# Patient Record
Sex: Female | Born: 1990 | Race: White | Hispanic: No | Marital: Single | Smoking: Never smoker
Health system: Southern US, Community
[De-identification: ages and names within clinical notes are randomized; demographics above are authoritative.]

## PROBLEM LIST (undated history)

## (undated) ENCOUNTER — Inpatient Hospital Stay (HOSPITAL_COMMUNITY): Payer: Self-pay

## (undated) DIAGNOSIS — Z87898 Personal history of other specified conditions: Secondary | ICD-10-CM

## (undated) DIAGNOSIS — F329 Major depressive disorder, single episode, unspecified: Secondary | ICD-10-CM

## (undated) DIAGNOSIS — F419 Anxiety disorder, unspecified: Secondary | ICD-10-CM

## (undated) DIAGNOSIS — E669 Obesity, unspecified: Secondary | ICD-10-CM

## (undated) DIAGNOSIS — B001 Herpesviral vesicular dermatitis: Secondary | ICD-10-CM

## (undated) DIAGNOSIS — N200 Calculus of kidney: Secondary | ICD-10-CM

## (undated) DIAGNOSIS — E119 Type 2 diabetes mellitus without complications: Secondary | ICD-10-CM

## (undated) DIAGNOSIS — J309 Allergic rhinitis, unspecified: Secondary | ICD-10-CM

## (undated) DIAGNOSIS — E282 Polycystic ovarian syndrome: Secondary | ICD-10-CM

## (undated) DIAGNOSIS — F32A Depression, unspecified: Secondary | ICD-10-CM

## (undated) HISTORY — DX: Obesity, unspecified: E66.9

## (undated) HISTORY — DX: Polycystic ovarian syndrome: E28.2

## (undated) HISTORY — DX: Major depressive disorder, single episode, unspecified: F32.9

## (undated) HISTORY — DX: Herpesviral vesicular dermatitis: B00.1

## (undated) HISTORY — DX: Depression, unspecified: F32.A

## (undated) HISTORY — DX: Personal history of other specified conditions: Z87.898

## (undated) HISTORY — DX: Allergic rhinitis, unspecified: J30.9

## (undated) HISTORY — DX: Anxiety disorder, unspecified: F41.9

## (undated) HISTORY — DX: Calculus of kidney: N20.0

---
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
## 2009-02-11 ENCOUNTER — Emergency Department (HOSPITAL_COMMUNITY): Admission: EM | Admit: 2009-02-11 | Discharge: 2009-02-12 | Payer: Self-pay | Admitting: Emergency Medicine

## 2009-06-02 ENCOUNTER — Emergency Department (HOSPITAL_COMMUNITY): Admission: EM | Admit: 2009-06-02 | Discharge: 2009-06-02 | Payer: Self-pay | Admitting: Emergency Medicine

## 2009-06-03 ENCOUNTER — Emergency Department (HOSPITAL_COMMUNITY): Admission: EM | Admit: 2009-06-03 | Discharge: 2009-06-04 | Payer: Self-pay | Admitting: Emergency Medicine

## 2010-10-07 ENCOUNTER — Inpatient Hospital Stay (HOSPITAL_COMMUNITY)
Admission: AD | Admit: 2010-10-07 | Discharge: 2010-10-07 | Payer: Self-pay | Source: Home / Self Care | Admitting: Obstetrics and Gynecology

## 2010-12-07 ENCOUNTER — Inpatient Hospital Stay (HOSPITAL_COMMUNITY)
Admission: AD | Admit: 2010-12-07 | Discharge: 2010-12-08 | Disposition: A | Discharge status: Home / Self Care | Payer: 59 | Source: Ambulatory Visit | Attending: Obstetrics and Gynecology | Admitting: Obstetrics and Gynecology

## 2010-12-13 ENCOUNTER — Inpatient Hospital Stay (HOSPITAL_COMMUNITY)
Admission: RE | Admit: 2010-12-13 | Discharge: 2010-12-16 | DRG: 766 | Disposition: A | Discharge status: Home / Self Care | Payer: 59 | Source: Ambulatory Visit | Attending: Obstetrics and Gynecology | Admitting: Obstetrics and Gynecology

## 2010-12-13 DIAGNOSIS — O480 Post-term pregnancy: Principal | ICD-10-CM | POA: Diagnosis present

## 2010-12-13 LAB — RPR: RPR Ser Ql: NONREACTIVE

## 2010-12-13 LAB — CBC
HCT: 35.9 % — ABNORMAL LOW (ref 36.0–46.0)
Hemoglobin: 12 g/dL (ref 12.0–15.0)
MCH: 27.8 pg (ref 26.0–34.0)
MCHC: 33.4 g/dL (ref 30.0–36.0)
RBC: 4.31 MIL/uL (ref 3.87–5.11)

## 2010-12-14 LAB — CBC
HCT: 24.9 % — ABNORMAL LOW (ref 36.0–46.0)
Hemoglobin: 8.3 g/dL — ABNORMAL LOW (ref 12.0–15.0)
MCV: 84.1 fL (ref 78.0–100.0)
RDW: 13.8 % (ref 11.5–15.5)
WBC: 12.5 10*3/uL — ABNORMAL HIGH (ref 4.0–10.5)

## 2010-12-27 NOTE — Op Note (Signed)
  Morgan Ward, Morgan Ward                ACCOUNT NO.:  [id number]  MEDICAL RECORD NO.:  [id number]           PATIENT TYPE:  I  LOCATION:  9130                          FACILITY:  [facility]  PHYSICIAN:  Sharise Lippy L. Ivery Michalski, M.D.DATE OF BIRTH:  [birth year]  DATE OF PROCEDURE:  12/13/2010 DATE OF DISCHARGE:                              OPERATIVE REPORT   PREOPERATIVE DIAGNOSIS:  Intrauterine pregnancy at 40 plus weeks, failure to progress.  POSTOPERATIVE DIAGNOSIS:  Intrauterine pregnancy at 40 plus weeks, failure to progress.  PROCEDURE:  Primary low transverse cesarean section.  SURGEON:  Sihaam Chrobak L. Leahanna Buser, MD  ANESTHESIA:  Epidural.  FINDINGS:  Female infant in cephalic presentation, weighing 8 pounds 15 ounces, Apgars 8 at 1 minute, 9 at 5 minutes.  COMPLICATIONS:  None.  ESTIMATED BLOOD LOSS:  500 mL.  DRAINS:  Foley.  DESCRIPTION OF PROCEDURE:  The patient was taken to the operating room from Labor and Delivery.  She was then prepped and draped and her epidural was found to be adequate.  A low transverse incision was made and carried down to the fascia.  Fascia scored in the midline, extended laterally.  Rectus muscles were separated in the midline.  The peritoneum was entered bluntly.  The peritoneal incision was then stretched.  The bladder blade was inserted and the lower uterine segment was identified and the bladder flap was then created sharply and then digitally.  Bladder blade was then readjusted, a low transverse incision was made in the uterus, uterus was entered using a hemostat.  The baby was in cephalic presentation, was delivered easily with a vacuum extractor, appeared to be large for gestational age with a female infant, Apgars 8 at 1 minute and 9 at 5 minutes and weighed 8 pounds 15 ounces. The cord was clamped and cut, the baby was handed to the awaiting pediatrician.  The uterus was exteriorized.  It was cleared of all clots and debris.  The antibiotics and  Pitocin were given.  The uterine incision was closed in one layer using 0 Vicryl in a running locked stitch.  Uterus was returned to the abdomen.  Irrigation was performed. Hemostasis was excellent.  The peritoneum was closed using 0 Vicryl with running stitch.  The rectus muscles were reapproximated using the same 0 Vicryl.  The fascia was closed using 0 Vicryl starting each corner and meeting in the midline with a running stitch.  After irrigation of subcutaneous layer, the skin was closed with staples.  All sponge, lap, and instrument counts were correct x2.  The patient went to recovery room in stable condition.     Moises Terpstra L. Vincente Poli, M.D.     Florestine Avers  D:  12/13/2010  T:  12/14/2010  Job:  [id number]  Electronically Signed by Marcelle Overlie M.D. on 12/27/2010 07:56:05 AM

## 2011-01-16 LAB — URINALYSIS, ROUTINE W REFLEX MICROSCOPIC
Bilirubin Urine: NEGATIVE
Glucose, UA: NEGATIVE mg/dL
Hgb urine dipstick: NEGATIVE
Ketones, ur: NEGATIVE mg/dL
Nitrite: NEGATIVE
Protein, ur: NEGATIVE mg/dL
Specific Gravity, Urine: 1.025 (ref 1.005–1.030)
Urobilinogen, UA: 0.2 mg/dL (ref 0.0–1.0)
pH: 6.5 (ref 5.0–8.0)

## 2011-01-16 LAB — CBC
HCT: 32.1 % — ABNORMAL LOW (ref 36.0–46.0)
MCH: 31 pg (ref 26.0–34.0)
MCHC: 34.7 g/dL (ref 30.0–36.0)
MCV: 89.6 fL (ref 78.0–100.0)
Platelets: 226 10*3/uL (ref 150–400)
RDW: 12.8 % (ref 11.5–15.5)

## 2011-01-16 LAB — URINE MICROSCOPIC-ADD ON

## 2011-01-16 LAB — WET PREP, GENITAL
Trich, Wet Prep: NONE SEEN
Yeast Wet Prep HPF POC: NONE SEEN

## 2011-01-22 NOTE — Discharge Summary (Signed)
Morgan Ward, Morgan Ward                ACCOUNT NO.:  [id number]  MEDICAL RECORD NO.:  [id number]           PATIENT TYPE:  I  LOCATION:  9130                          [facility]ILITY:  [facility]  PHYSICIAN:  Dineen Kid. Rana Snare, M.D.    DATE OF BIRTH:  Jan 26, 1991  DATE OF ADMISSION:  12/13/2010 DATE OF DISCHARGE:  12/16/2010                              DISCHARGE SUMMARY   ADMITTING DIAGNOSES: 1. Intrauterine pregnancy at 40-1/7 weeks estimated gestational age. 2. Induction of labor secondary to postdates.  DISCHARGE DIAGNOSES: 1. Status post low-transverse cesarean section secondary to failure to     progress. 2. Viable female infant.  PROCEDURE:  Primary low-transverse cesarean section.  REASON FOR ADMISSION:  Please see written H and P.  HOSPITAL COURSE:  The patient is a 20 year old primigravida who was admitted to [facility] at 40-1/7 weeks estimated gestational age for postdate induction of labor.  On admission, vital signs were stable.  Fetal heart tones were reassuring.  Contractions were somewhat irregular.  Cervix was examined and found to be 4 cm dilated, 80% effaced, vertex at a 2 station.  Artificial rupture of membranes was performed, which revealed clear fluid.  The patient started on Pitocin and epidural was administered for her comfort.  After laboring all day without further change in cervix, decision was made to proceed with a primary low-transverse cesarean section.  The patient was then transferred to the operating room where epidural was dosed to an adequate surgical level.  A low-transverse incision was made with delivery of a viable female infant, weighing 8 pounds 15 ounces with Apgars of 8 at 1 minute and 9 at 5 minutes.  The patient tolerated the procedure well and was taken to the recovery room in stable condition. On postoperative day #1, the patient was without complaint.  Vital signs were stable.  She was afebrile.  Fundus firm and nontender.   Abdominal dressing had a small amount of drainage noted on the bandage.  Foley had been discontinued.  However, she had not voided at the time of rounding.  LABORATORY FINDINGS:  Hemoglobin 8.3 and platelet count of 207,000.  On postoperative day #2, the patient did complain of some pedal edema. Vital signs were stable.  Blood pressure was 118/64 to 125/68.  Abdomen soft.  Fundus firm and nontender.  Incision was clean, dry, and intact. She is voiding well, 2+ pedal edema was noted.  On postoperative day #3, the patient was without complaint.  Vital signs remained stable.  She was afebrile.  Fundus firm and nontender.  Incision was clean, dry and intact.  DISCHARGE INSTRUCTIONS:  Reviewed and the patient was later discharged home.  CONDITION ON DISCHARGE:  Stable.  DIET:  Regular as tolerated.  ACTIVITY:  No heavy lifting, no driving x2 weeks, and no vaginal entry.  FOLLOWUP:  The patient is to follow up in the office in 3-4 days for staple removal.  She is to call for temperature greater than 100 degrees, persistent nausea, vomiting, heavy vaginal bleeding and/or redness, or drainage from the incisional site.  DISCHARGE MEDICATIONS: 1. Tylox #30 one p.o. every  4-6 hours. 2. Motrin 600 mg every 6 hours. 3. Prenatal vitamins 1 p.o. daily. 4. Colace 1 p.o. daily p.r.n.     Morgan Ward, N.P.   ______________________________ Dineen Kid Rana Snare, M.D.    CC/MEDQ  D:  01/11/2011  T:  01/11/2011  Job:  [id number]  Electronically Signed by Morgan Ward N.P. on 01/12/2011 08:32:02 AM Electronically Signed by Candice Camp M.D. on 01/22/2011 07:26:05 AM

## 2011-02-11 LAB — URINALYSIS, ROUTINE W REFLEX MICROSCOPIC
Bilirubin Urine: NEGATIVE
Bilirubin Urine: NEGATIVE
Glucose, UA: NEGATIVE mg/dL
Glucose, UA: NEGATIVE mg/dL
Ketones, ur: >80 mg/dL — AB
Ketones, ur: >80 mg/dL — AB
Nitrite: NEGATIVE
Protein, ur: 30 mg/dL — AB
Protein, ur: NEGATIVE mg/dL
Urobilinogen, UA: 1 mg/dL (ref 0.0–1.0)
pH: 6 (ref 5.0–8.0)

## 2011-02-11 LAB — DIFFERENTIAL
Eosinophils Relative: 0 % (ref 0–5)
Lymphocytes Relative: 9 % — ABNORMAL LOW (ref 12–46)
Lymphs Abs: 1.1 10*3/uL (ref 0.7–4.0)
Monocytes Relative: 5 % (ref 3–12)
Neutrophils Relative %: 87 % — ABNORMAL HIGH (ref 43–77)

## 2011-02-11 LAB — URINE MICROSCOPIC-ADD ON

## 2011-02-11 LAB — CBC
HCT: 39.8 % (ref 36.0–46.0)
Platelets: 272 10*3/uL (ref 150–400)
RBC: 4.49 MIL/uL (ref 3.87–5.11)
WBC: 12.6 10*3/uL — ABNORMAL HIGH (ref 4.0–10.5)

## 2011-02-11 LAB — BASIC METABOLIC PANEL
CO2: 19 mEq/L (ref 19–32)
Chloride: 106 mEq/L (ref 96–112)
GFR calc Af Amer: >60 mL/min (ref >60)
Glucose, Bld: 107 mg/dL — ABNORMAL HIGH (ref 70–99)
Potassium: 3.9 mEq/L (ref 3.5–5.1)
Sodium: 135 mEq/L (ref 135–145)

## 2011-02-14 LAB — PREGNANCY, URINE: Preg Test, Ur: NEGATIVE

## 2011-02-14 LAB — URINALYSIS, ROUTINE W REFLEX MICROSCOPIC
Glucose, UA: NEGATIVE mg/dL
Specific Gravity, Urine: 1.023 (ref 1.005–1.030)
pH: 6.5 (ref 5.0–8.0)

## 2011-02-14 LAB — URINE MICROSCOPIC-ADD ON

## 2011-02-14 LAB — URINE CULTURE: Colony Count: 15000

## 2011-08-28 ENCOUNTER — Encounter: Payer: Self-pay | Admitting: Physician Assistant

## 2011-08-28 DIAGNOSIS — Z6841 Body Mass Index (BMI) 40.0 and over, adult: Secondary | ICD-10-CM | POA: Insufficient documentation

## 2011-08-28 DIAGNOSIS — J309 Allergic rhinitis, unspecified: Secondary | ICD-10-CM | POA: Insufficient documentation

## 2011-08-28 DIAGNOSIS — F418 Other specified anxiety disorders: Secondary | ICD-10-CM | POA: Insufficient documentation

## 2011-08-28 DIAGNOSIS — N200 Calculus of kidney: Secondary | ICD-10-CM | POA: Insufficient documentation

## 2011-08-28 DIAGNOSIS — E282 Polycystic ovarian syndrome: Secondary | ICD-10-CM | POA: Insufficient documentation

## 2011-08-28 DIAGNOSIS — B001 Herpesviral vesicular dermatitis: Secondary | ICD-10-CM | POA: Insufficient documentation

## 2012-04-08 ENCOUNTER — Ambulatory Visit (INDEPENDENT_AMBULATORY_CARE_PROVIDER_SITE_OTHER): Payer: 59 | Admitting: Physician Assistant

## 2012-04-08 VITALS — BP 129/68 | HR 76 | Temp 98.1°F | Resp 16 | Ht 60.75 in | Wt 241.0 lb

## 2012-04-08 DIAGNOSIS — F411 Generalized anxiety disorder: Secondary | ICD-10-CM

## 2012-04-08 DIAGNOSIS — F419 Anxiety disorder, unspecified: Secondary | ICD-10-CM

## 2012-04-08 DIAGNOSIS — J309 Allergic rhinitis, unspecified: Secondary | ICD-10-CM

## 2012-04-08 MED ORDER — FLUTICASONE PROPIONATE 50 MCG/ACT NA SUSP: 2.0000 | Freq: Every day | NASAL | Status: DC

## 2012-04-08 MED ORDER — ALPRAZOLAM 0.5 MG PO TABS: 0.5000 mg | ORAL_TABLET | Freq: Two times a day (BID) | ORAL | Status: AC | PRN

## 2012-04-08 NOTE — Progress Notes (Signed)
Patient ID: Morgan Ward MRN: [id number], DOB: 30-Apr-1991, 21 y.o. Date of Encounter: 04/08/2012, 6:34 PM  Primary Physician: No primary provider on file.  Chief Complaint: Anxiety  HPI: 21 y.o. year old female with history below presents with anxiety. Under increased amounts of stress at home. History of depression and anxiety through out her life. Has been off of her Celexa for about a month now. Anxiety began to worsen about 1-2 weeks ago due to her increased amount of stress at home. She is having a difficult time turning her mind off at night. Unable to sleep. She denies any depression. Wound prefer to stay off the Celexa now. Denies any depression. No suicidal or homicidal ideations. Good support system with family and friends.  She also mentions a flare up of her allergies and mild sinus pressure. Afebrile.   Past Medical History  Diagnosis Date  . Allergic rhinitis, cause unspecified   . PCOS (polycystic ovarian syndrome)   . Nephrolithiasis   . Depression   . Obesity   . History of angioedema   . Recurrent herpes labialis   . Anxiety   . Depression      Home Meds: Prior to Admission medications   Medication Sig Start Date End Date Taking? Authorizing Provider         citalopram (CELEXA) 20 MG tablet Take 20 mg by mouth daily.      Historical Provider, MD           Allergies:  Allergies  Allergen Reactions  . Sertraline Hcl (Sertraline Hcl) Diarrhea, Nausea Only and Other (See Comments)    headache  . Wellbutrin (Bupropion Hcl) Itching    History   Social History  . Marital Status: Single    Spouse Name: N/A    Number of Children: N/A  . Years of Education: N/A   Occupational History  . Not on file.   Social History Main Topics  . Smoking status: Never Smoker   . Smokeless tobacco: Not on file  . Alcohol Use: Not on file  . Drug Use: Not on file  . Sexually Active: Not on file   Other Topics Concern  . Not on file   Social History Narrative  .  No narrative on file     Review of Systems: Constitutional: negative for chills, fever, night sweats, or weight changes HEENT: negative for vision changes or hearing loss Cardiovascular: negative for chest pain or palpitations Respiratory: negative for hemoptysis, wheezing, shortness of breath, or cough Abdominal: negative for abdominal pain, nausea, or vomiting Dermatological: negative for rash Psychological: see above Neurologic: negative for headache, dizziness, or syncope   Physical Exam: Blood pressure 129/68, pulse 76, temperature 98.1 F (36.7 C), temperature source Oral, resp. rate 16, height 5' 0.75" (1.543 m), weight 241 lb (109.317 kg), last menstrual period 04/07/2012, SpO2 97.00%., Body mass index is 45.91 kg/(m^2). General: Well developed, well nourished, in no acute distress. Head: Normocephalic, atraumatic, eyes without discharge, sclera non-icteric, nares are without discharge. Post nasal drip.   Neck: Supple. No thyromegaly. Full ROM. No lymphadenopathy. Lungs: Clear bilaterally to auscultation without wheezes, rales, or rhonchi. Breathing is unlabored. Heart: RRR with S1 S2. No murmurs, rubs, or gallops appreciated. Msk:  Strength and tone normal for age. Extremities/Skin: Warm and dry. No clubbing or cyanosis. No edema. No rashes or suspicious lesions. Neuro: Alert and oriented X 3. Moves all extremities spontaneously. Gait is normal. CNII-XII grossly in tact. Psych:  Responds to questions appropriately with  a normal affect.      ASSESSMENT AND PLAN:  21 y.o. year old female with anxiety and allergic rhinits. 1. Anxiety -Trial of Xanax 0.5 mg #40 1 po bid prn no RF -Call with update 10-14 days, sooner if worse -RTC/ER precautions -Declines to restart Celexa at this time -Verbal safety contract   2. Allergic rhinitis -Flonase 2 sprays each nare daily #1 RF 6   Signed, Mieshia Pepitone, PA-C 04/08/2012 6:34 PM

## 2012-04-18 ENCOUNTER — Telehealth: Payer: Self-pay

## 2012-04-18 NOTE — Telephone Encounter (Signed)
PT WAS TO CALL BACK AND LET us KNOW HOW THE MEDICINE DID AND THE DOSAGE ALSO PLEASE CALL [phone]

## 2012-04-20 NOTE — Telephone Encounter (Signed)
PT STATES THAT SHE IS DOING WELL ON THE DOSE OF XANAX THAT YOU PRESCRIBE.  DO YOU WANT TO GIVE HER REFILLS?

## 2012-04-21 NOTE — Telephone Encounter (Signed)
Noted. Due for refill on 04/28/12.

## 2012-06-20 ENCOUNTER — Ambulatory Visit (INDEPENDENT_AMBULATORY_CARE_PROVIDER_SITE_OTHER): Payer: 59 | Admitting: Family Medicine

## 2012-06-20 VITALS — BP 132/80 | HR 101 | Temp 98.0°F | Resp 22 | Ht 60.0 in | Wt 227.2 lb

## 2012-06-20 DIAGNOSIS — R309 Painful micturition, unspecified: Secondary | ICD-10-CM

## 2012-06-20 DIAGNOSIS — N912 Amenorrhea, unspecified: Secondary | ICD-10-CM

## 2012-06-20 DIAGNOSIS — N23 Unspecified renal colic: Secondary | ICD-10-CM

## 2012-06-20 DIAGNOSIS — N898 Other specified noninflammatory disorders of vagina: Secondary | ICD-10-CM

## 2012-06-20 LAB — POCT URINALYSIS DIPSTICK
Bilirubin, UA: NEGATIVE
Blood, UA: NEGATIVE
Glucose, UA: NEGATIVE
Ketones, UA: NEGATIVE
Leukocytes, UA: NEGATIVE
Nitrite, UA: NEGATIVE
Protein, UA: NEGATIVE
Spec Grav, UA: >=1.03
Urobilinogen, UA: 0.2
pH, UA: 7

## 2012-06-20 LAB — POCT UA - MICROSCOPIC ONLY
Bacteria, U Microscopic: NEGATIVE
Casts, Ur, LPF, POC: NEGATIVE
Crystals, Ur, HPF, POC: NEGATIVE
Mucus, UA: NEGATIVE
RBC, urine, microscopic: NEGATIVE
WBC, Ur, HPF, POC: NEGATIVE
Yeast, UA: NEGATIVE

## 2012-06-20 LAB — POCT CBC
Hemoglobin: 13.1 g/dL (ref 12.2–16.2)
Lymph, poc: 2.3 (ref 0.6–3.4)
MCHC: 31.1 g/dL — AB (ref 31.8–35.4)
MID (cbc): 0.4 (ref 0–0.9)
MPV: 9.5 fL (ref 0–99.8)
POC Granulocyte: 3.9 (ref 2–6.9)
POC LYMPH PERCENT: 34.1 %L (ref 10–50)
POC MID %: 3.7 %M (ref 0–12)
Platelet Count, POC: 375 10*3/uL (ref 142–424)
RDW, POC: 14 %

## 2012-06-20 LAB — POCT WET PREP WITH KOH
KOH Prep POC: NEGATIVE
Trichomonas, UA: NEGATIVE
Yeast Wet Prep HPF POC: NEGATIVE

## 2012-06-20 MED ORDER — NITROFURANTOIN MONOHYD MACRO 100 MG PO CAPS: ORAL_CAPSULE | ORAL | Status: DC

## 2012-06-20 NOTE — Progress Notes (Signed)
[facility] [address], [place] [address] [phone]  Date:  06/20/2012   Name:  Morgan Ward   DOB:  1991/05/14   MRN:  [id number]  PCP:  No primary provider on file.    Chief Complaint: Urinary Tract Infection   History of Present Illness:  Morgan Ward is a 21 y.o. very pleasant female patient who presents with the following:  She is concerned about recurrent UTI.  She had a UTI in late June.  She then became pregnant and had an abortion on July 15th.  On her follow- up exam she had an ultrasound and HCG- she was no longer pregnant at that time.  However, she has had 2 UTIs since her procedure.  She became pregnant when she cheated on her finance- she is pretty upset about the whole situation.   She was started on Septra for another UTI 2 days ago- she was seen at [facility].  She is not sure if they took a culture.  She has one more day of medication to go. Continues to note pain "across my lower belly" both with urination and without.    She has had these 3 UTIs in the past year or so.  She is now on OCP- no menses yet since her procedure.  She is on the first week of her 2nd pack of pills.  No nausea or vomiting, no fever.  Eating normally.  She was on some weight loss medication per her OBG- wants to know if I can refill this.   Patient Active Problem List  Diagnosis  . Allergic rhinitis, cause unspecified  . PCOS (polycystic ovarian syndrome)  . Nephrolithiasis  . Depression  . Obesity  . Recurrent herpes labialis    Past Medical History  Diagnosis Date  . Allergic rhinitis, cause unspecified   . PCOS (polycystic ovarian syndrome)   . Nephrolithiasis   . Depression   . Obesity   . History of angioedema   . Recurrent herpes labialis   . Anxiety   . Depression     No past surgical history on file.  History  Substance Use Topics  . Smoking status: Never Smoker   . Smokeless tobacco: Not on file  . Alcohol Use: Not on file     No family history on file.  Allergies  Allergen Reactions  . Sertraline Hcl (Sertraline Hcl) Diarrhea, Nausea Only and Other (See Comments)    headache  . Wellbutrin (Bupropion Hcl) Itching    Medication list has been reviewed and updated.  Current Outpatient Prescriptions on File Prior to Visit  Medication Sig Dispense Refill  . norethindrone-ethinyl estradiol (JUNEL FE,GILDESS FE,LOESTRIN FE) 1-20 MG-MCG tablet Take 1 tablet by mouth daily.      . citalopram (CELEXA) 20 MG tablet Take 20 mg by mouth daily.        . fluticasone (FLONASE) 50 MCG/ACT nasal spray Place 2 sprays into the nose daily.  16 g  6    Review of Systems:  As per HPI- otherwise negative.   Physical Examination: Filed Vitals:   06/20/12 1629  BP: 132/80  Pulse: 101  Temp: 98 F (36.7 C)  Resp: 22   Filed Vitals:   06/20/12 1629  Height: 5' (1.524 m)  Weight: 227 lb 3.2 oz (103.057 kg)   Body mass index is 44.37 kg/(m^2). Ideal Body Weight: Weight in (lb) to have BMI = 25: 127.7   GEN: WDWN, NAD,  Non-toxic, A & O x 3, obese HEENT: Atraumatic, Normocephalic. Neck supple. No masses, No LAD.  Oropharynx wnl, PEERL Ears and Nose: No external deformity. CV: RRR, No M/G/R. No JVD. No thrill. No extra heart sounds. PULM: CTA B, no wheezes, crackles, rhonchi. No retractions. No resp. distress. No accessory muscle use. ABD: S, ND, +BS. No rebound. No HSM.  No CVA tenderness.  She has minimal suprapubic/ RLQ/ RUQ tenderness not consistent with appendicitis.  Negative murphy's sign EXTR: No c/c/e NEURO Normal gait.  PSYCH: Normally interactive. Conversant. Not depressed or anxious appearing.  Calm demeanor.  GU: normal exam, no CMT or adnexal tenderness.  No abnormal discharge, no external lesions.     Results for orders placed in visit on 06/20/12  POCT UA - MICROSCOPIC ONLY      Component Value Range   WBC, Ur, HPF, POC neg     RBC, urine, microscopic neg     Bacteria, U Microscopic neg      Mucus, UA neg     Epithelial cells, urine per micros 0-4     Crystals, Ur, HPF, POC neg     Casts, Ur, LPF, POC neg     Yeast, UA neg    POCT URINALYSIS DIPSTICK      Component Value Range   Color, UA yellow     Clarity, UA clear     Glucose, UA neg     Bilirubin, UA neg     Ketones, UA neg     Spec Grav, UA >=1.030     Blood, UA neg     pH, UA 7.0     Protein, UA neg     Urobilinogen, UA 0.2     Nitrite, UA neg     Leukocytes, UA Negative    POCT URINE PREGNANCY      Component Value Range   Preg Test, Ur Negative    POCT WET PREP WITH KOH      Component Value Range   Trichomonas, UA Negative     Clue Cells Wet Prep HPF POC 0-1     Epithelial Wet Prep HPF POC 0-5     Yeast Wet Prep HPF POC neg     Bacteria Wet Prep HPF POC 2+     RBC Wet Prep HPF POC neg     WBC Wet Prep HPF POC 0-5     KOH Prep POC Negative    POCT CBC      Component Value Range   WBC 6.6  4.6 - 10.2 K/uL   Lymph, poc 2.3  0.6 - 3.4   POC LYMPH PERCENT 34.1  10 - 50 %L   MID (cbc) 0.4  0 - 0.9   POC MID % 3.7  0 - 12 %M   POC Granulocyte 3.9  2 - 6.9   Granulocyte percent 59.2  37 - 80 %G   RBC 4.75  4.04 - 5.48 M/uL   Hemoglobin 13.1  12.2 - 16.2 g/dL   HCT, POC 16.1  09.6 - 47.9 %   MCV 88.6  80 - 97 fL   MCH, POC 27.6  27 - 31.2 pg   MCHC 31.1 (*) 31.8 - 35.4 g/dL   RDW, POC 04.5     Platelet Count, POC 375  142 - 424 K/uL   MPV 9.5  0 - 99.8 fL    Assessment and Plan: 1. Urinary pain  POCT UA - Microscopic Only, POCT urinalysis dipstick, POCT CBC,  nitrofurantoin, macrocrystal-monohydrate, (MACROBID) 100 MG capsule  2. Vaginal Discharge  POCT Wet Prep with KOH, GC/chlamydia probe amp, genital  3. Amenorrhea  POCT urine pregnancy   Recurrent ?UTI.  It seems as through her symptoms are more pain, less classic UTI symptoms.  Await genprobe as infection is a possibility.  Gave supply of macrobid to use after intercourse as needed, as she has noted possible recurrent UTI after intercourse.   Also consider ovarian cyst.  If her labs are negative and her symptoms persist consider pelvic ultrasound.  It is also possible that emotional upset is contributing to her pain.  If she gets worse or has any other symptoms please let me know right away.    Abbe Amsterdam, MD

## 2013-01-23 ENCOUNTER — Other Ambulatory Visit: Payer: Self-pay | Admitting: Family Medicine

## 2015-06-11 ENCOUNTER — Ambulatory Visit (INDEPENDENT_AMBULATORY_CARE_PROVIDER_SITE_OTHER): Payer: 59 | Admitting: Family Medicine

## 2015-06-11 ENCOUNTER — Ambulatory Visit (INDEPENDENT_AMBULATORY_CARE_PROVIDER_SITE_OTHER): Payer: 59

## 2015-06-11 VITALS — BP 118/76 | HR 81 | Temp 98.2°F | Resp 18 | Ht 60.0 in | Wt 228.0 lb

## 2015-06-11 DIAGNOSIS — R1030 Lower abdominal pain, unspecified: Secondary | ICD-10-CM | POA: Diagnosis not present

## 2015-06-11 DIAGNOSIS — K5900 Constipation, unspecified: Secondary | ICD-10-CM | POA: Diagnosis not present

## 2015-06-11 DIAGNOSIS — K625 Hemorrhage of anus and rectum: Secondary | ICD-10-CM

## 2015-06-11 DIAGNOSIS — R110 Nausea: Secondary | ICD-10-CM

## 2015-06-11 LAB — POCT URINALYSIS DIPSTICK
Bilirubin, UA: NEGATIVE
Blood, UA: NEGATIVE
Glucose, UA: NEGATIVE
Ketones, UA: NEGATIVE
Leukocytes, UA: NEGATIVE
Nitrite, UA: NEGATIVE
Protein, UA: NEGATIVE
Spec Grav, UA: 1.025
Urobilinogen, UA: 0.2
pH, UA: 7

## 2015-06-11 LAB — POCT CBC
Granulocyte percent: 77.2 %G (ref 37–80)
HCT, POC: 39.5 % (ref 37.7–47.9)
Hemoglobin: 12.7 g/dL (ref 12.2–16.2)
Lymph, poc: 2.3 (ref 0.6–3.4)
MCH, POC: 27.8 pg (ref 27–31.2)
MCHC: 32.3 g/dL (ref 31.8–35.4)
MCV: 86.1 fL (ref 80–97)
MID (cbc): 0.2 (ref 0–0.9)
MPV: 7.6 fL (ref 0–99.8)
POC Granulocyte: 8.6 — AB (ref 2–6.9)
POC LYMPH PERCENT: 20.7 %L (ref 10–50)
POC MID %: 2.1 %M (ref 0–12)
Platelet Count, POC: 341 10*3/uL (ref 142–424)
RBC: 4.58 M/uL (ref 4.04–5.48)
RDW, POC: 12.9 %
WBC: 11.2 10*3/uL — AB (ref 4.6–10.2)

## 2015-06-11 LAB — POCT UA - MICROSCOPIC ONLY
Casts, Ur, LPF, POC: NEGATIVE
Crystals, Ur, HPF, POC: NEGATIVE
Yeast, UA: NEGATIVE

## 2015-06-11 LAB — IFOBT (OCCULT BLOOD): IFOBT: POSITIVE

## 2015-06-11 LAB — POCT URINE PREGNANCY: Preg Test, Ur: NEGATIVE

## 2015-06-11 MED ORDER — AZITHROMYCIN 250 MG PO TABS: ORAL_TABLET | ORAL | Status: DC

## 2015-06-11 NOTE — Patient Instructions (Signed)
Abdominal Pain, Women °Abdominal (stomach, pelvic, or belly) pain can be caused by many things. It is important to tell your doctor: °· The location of the pain. °· Does it come and go or is it present all the time? °· Are there things that start the pain (eating certain foods, exercise)? °· Are there other symptoms associated with the pain (fever, nausea, vomiting, diarrhea)? °All of this is helpful to know when trying to find the cause of the pain. °CAUSES  °· Stomach: virus or bacteria infection, or ulcer. °· Intestine: appendicitis (inflamed appendix), regional ileitis (Crohn's disease), ulcerative colitis (inflamed colon), irritable bowel syndrome, diverticulitis (inflamed diverticulum of the colon), or cancer of the stomach or intestine. °· Gallbladder disease or stones in the gallbladder. °· Kidney disease, kidney stones, or infection. °· Pancreas infection or cancer. °· Fibromyalgia (pain disorder). °· Diseases of the female organs: °¨ Uterus: fibroid (non-cancerous) tumors or infection. °¨ Fallopian tubes: infection or tubal pregnancy. °¨ Ovary: cysts or tumors. °¨ Pelvic adhesions (scar tissue). °¨ Endometriosis (uterus lining tissue growing in the pelvis and on the pelvic organs). °¨ Pelvic congestion syndrome (female organs filling up with blood just before the menstrual period). °¨ Pain with the menstrual period. °¨ Pain with ovulation (producing an egg). °¨ Pain with an IUD (intrauterine device, birth control) in the uterus. °¨ Cancer of the female organs. °· Functional pain (pain not caused by a disease, may improve without treatment). °· Psychological pain. °· Depression. °DIAGNOSIS  °Your doctor will decide the seriousness of your pain by doing an examination. °· Blood tests. °· X-rays. °· Ultrasound. °· CT scan (computed tomography, special type of X-ray). °· MRI (magnetic resonance imaging). °· Cultures, for infection. °· Barium enema (dye inserted in the large intestine, to better view it with  X-rays). °· Colonoscopy (looking in intestine with a lighted tube). °· Laparoscopy (minor surgery, looking in abdomen with a lighted tube). °· Major abdominal exploratory surgery (looking in abdomen with a large incision). °TREATMENT  °The treatment will depend on the cause of the pain.  °· Many cases can be observed and treated at home. °· Over-the-counter medicines recommended by your caregiver. °· Prescription medicine. °· Antibiotics, for infection. °· Birth control pills, for painful periods or for ovulation pain. °· Hormone treatment, for endometriosis. °· Nerve blocking injections. °· Physical therapy. °· Antidepressants. °· Counseling with a psychologist or psychiatrist. °· Minor or major surgery. °HOME CARE INSTRUCTIONS  °· Do not take laxatives, unless directed by your caregiver. °· Take over-the-counter pain medicine only if ordered by your caregiver. Do not take aspirin because it can cause an upset stomach or bleeding. °· Try a clear liquid diet (broth or water) as ordered by your caregiver. Slowly move to a bland diet, as tolerated, if the pain is related to the stomach or intestine. °· Have a thermometer and take your temperature several times a day, and record it. °· Bed rest and sleep, if it helps the pain. °· Avoid sexual intercourse, if it causes pain. °· Avoid stressful situations. °· Keep your follow-up appointments and tests, as your caregiver orders. °· If the pain does not go away with medicine or surgery, you may try: °¨ Acupuncture. °¨ Relaxation exercises (yoga, meditation). °¨ Group therapy. °¨ Counseling. °SEEK MEDICAL CARE IF:  °· You notice certain foods cause stomach pain. °· Your home care treatment is not helping your pain. °· You need stronger pain medicine. °· You want your IUD removed. °· You feel faint or   lightheaded. °· You develop nausea and vomiting. °· You develop a rash. °· You are having side effects or an allergy to your medicine. °SEEK IMMEDIATE MEDICAL CARE IF:  °· Your  pain does not go away or gets worse. °· You have a fever. °· Your pain is felt only in portions of the abdomen. The right side could possibly be appendicitis. The left lower portion of the abdomen could be colitis or diverticulitis. °· You are passing blood in your stools (bright red or black tarry stools, with or without vomiting). °· You have blood in your urine. °· You develop chills, with or without a fever. °· You pass out. °MAKE SURE YOU:  °· Understand these instructions. °· Will watch your condition. °· Will get help right away if you are not doing well or get worse. °Document Released: 08/19/2007 Document Revised: 03/08/2014 Document Reviewed: 09/08/2009 °[employer]® Patient Information ©2015 [employer]. This information is not intended to replace advice given to you by your health care provider. Make sure you discuss any questions you have with your health care provider. ° °

## 2015-06-11 NOTE — Progress Notes (Signed)
Chief Complaint:  Chief Complaint  Patient presents with  . Abdominal Pain    Started this morning, feels bloated. Lower abdominal pain.   . Blood In Stools    Noticed a small amount this morning after a BM.   Marland Kitchen Chills    HPI: Morgan Ward is a 24 y.o. female who reports to [facility] today complaining of stomach pain and had BM, was not diarrhea that looked not normal this AM and then later she tried to go have another BM , she had clots instead of stool.  She took over the counter stomach meds ie pepto , She came home and laid around. She had blood coming out with BM not.  No hemorrhoids.  Mom had diverticulitis and had intestine removed but no crohns UC  IBD or colon cancer LMP July 17, she is sexually active but not for a while + PCOS and had no ovarian cyst several months ago.  Last time she ahd sex was ? 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  She had a pap in 2016, [place] [facility] and was normal Not on birth control, no fevers or chills. Not associated with food, she is eating and drinking normally.    Past Medical History  Diagnosis Date  . Allergic rhinitis, cause unspecified   . PCOS (polycystic ovarian syndrome)   . Nephrolithiasis   . Depression   . Obesity   . History of angioedema   . Recurrent herpes labialis   . Anxiety   . Depression    Past Surgical History  Procedure Laterality Date  . Cesarean section     History   Social History  . Marital Status: Single    Spouse Name: N/A  . Number of Children: N/A  . Years of Education: N/A   Social History Main Topics  . Smoking status: Never Smoker   . Smokeless tobacco: Not on file  . Alcohol Use: Not on file  . Drug Use: Not on file  . Sexual Activity: Not on file   Other Topics Concern  . None   Social History Narrative   Family History  Problem Relation Age of Onset  . Diverticulitis Mother   .  Diabetes Mother   . Hypertension Mother    Allergies  Allergen Reactions  . Sertraline Hcl [Sertraline Hcl] Diarrhea, Nausea Only and Other (See Comments)    headache  . Wellbutrin [Bupropion Hcl] Itching   Prior to Admission medications   Medication Sig Start Date End Date Taking? Authorizing Provider  citalopram (CELEXA) 20 MG tablet Take 20 mg by mouth daily.      Historical Provider, MD  fluticasone (FLONASE) 50 MCG/ACT nasal spray Place 2 sprays into the nose daily. 04/08/12 04/08/13  Lagace M Dunn, PA-C  nitrofurantoin, macrocrystal-monohydrate, (MACROBID) 100 MG capsule TAKE ONE CAPSULE BY MOUTH AS NEEDED AFTER  INTERCOURSE Patient not taking: Reported on 06/11/2015 01/23/13   Areta Haber Dunn, PA-C  norethindrone-ethinyl estradiol (JUNEL FE,GILDESS FE,LOESTRIN FE) 1-20 MG-MCG tablet Take 1 tablet by mouth daily.    Historical Provider, MD     ROS: The patient denies fevers, chills, night sweats, unintentional weight loss, chest pain, palpitations, wheezing, dyspnea on exertion, , vomiting, dysuria, hematuria, melena, numbness, weakness, or tingling.   All other systems have been reviewed and were otherwise negative with the exception of those mentioned  in the HPI and as above.    PHYSICAL EXAM: Filed Vitals:   06/11/15 1542  BP: 118/76  Pulse: 81  Temp: 98.2 F (36.8 C)  Resp: 18   Body mass index is 44.53 kg/(m^2).   General: Alert, no acute distress, obese white female HEENT:  Normocephalic, atraumatic, oropharynx patent. EOMI, PERRLA Cardiovascular:  Regular rate and rhythm, no rubs murmurs or gallops.  No Carotid bruits, radial pulse intact. No pedal edema.  Respiratory: Clear to auscultation bilaterally.  No wheezes, rales, or rhonchi.  No cyanosis, no use of accessory musculature Abdominal: No organomegaly, abdomen is soft and bilateral pelvic area minimally -tender, positive bowel sounds. No masses. No guarding or rebound Skin: No rashes. Neurologic: Facial musculature  symmetric. Psychiatric: Patient acts appropriately throughout our interaction. Lymphatic: No cervical or submandibular lymphadenopathy Musculoskeletal: Gait intact. No edema, tenderness No CMT, vaginal vault normal. No discahrge No hemorrhoids, no appreciable fissures but not absolutely sure since difficut to fully access base don large body habitus No masses in rectal vault or hemorrhoids   LABS: Results for orders placed or performed in visit on 06/11/15  POCT CBC  Result Value Ref Range   WBC 11.2 (A) 4.6 - 10.2 K/uL   Lymph, poc 2.3 0.6 - 3.4   POC LYMPH PERCENT 20.7 10 - 50 %L   MID (cbc) 0.2 0 - 0.9   POC MID % 2.1 0 - 12 %M   POC Granulocyte 8.6 (A) 2 - 6.9   Granulocyte percent 77.2 37 - 80 %G   RBC 4.58 4.04 - 5.48 M/uL   Hemoglobin 12.7 12.2 - 16.2 g/dL   HCT, POC 39.5 37.7 - 47.9 %   MCV 86.1 80 - 97 fL   MCH, POC 27.8 27 - 31.2 pg   MCHC 32.3 31.8 - 35.4 g/dL   RDW, POC 12.9 %   Platelet Count, POC 341 142 - 424 K/uL   MPV 7.6 0 - 99.8 fL  POCT urine pregnancy  Result Value Ref Range   Preg Test, Ur Negative Negative  POCT urinalysis dipstick  Result Value Ref Range   Color, UA yellow    Clarity, UA clear    Glucose, UA negative    Bilirubin, UA negative    Ketones, UA negative    Spec Grav, UA 1.025    Blood, UA negative    pH, UA 7.0    Protein, UA negative    Urobilinogen, UA 0.2    Nitrite, UA negative    Leukocytes, UA Negative Negative  POCT UA - Microscopic Only  Result Value Ref Range   WBC, Ur, HPF, POC 0-1    RBC, urine, microscopic 0-1    Bacteria, U Microscopic trace    Mucus, UA small    Epithelial cells, urine per micros 0-3    Crystals, Ur, HPF, POC negative    Casts, Ur, LPF, POC negative    Yeast, UA negative   IFOBT POC (occult bld, rslt in office)  Result Value Ref Range   IFOBT Positive      EKG/XRAY:   Primary read interpreted by Dr. Marin Comment at [facility]. Please comment if there is obstruction + taken peptobismol recently.  No  free air   ASSESSMENT/PLAN: Encounter Diagnoses  Name Primary?  . Nausea without vomiting Yes  . Lower abdominal pain   . Rectal bleeding   . Constipation, unspecified constipation type    Multifactorial etiology I think this maybe related to constipation, gas  with  some straining resulting in having "small clots" with straining while on toilet for BM.  She has a hx of ovarian cysts as well which could be another possibility vs appendicitis, which I think less likely since she has bilateral pelvic pain.  Will treat for PID with azithromycin, encourage laxatives PRecautiosn given for worsening sxs Note given Labs pending: CMP, lipase, ESR If no improvement then consider CT or [place] abd and pelvis Needs to recheck hemosure at some point  Gross sideeffects, risk and benefits, and alternatives of medications d/w patient. Patient is aware that all medications have potential sideeffects and we are unable to predict every sideeffect or drug-drug interaction that may occur.  Nikiyah Fackler DO  06/12/2015 10:16 AM

## 2015-06-12 LAB — COMPREHENSIVE METABOLIC PANEL
ALT: 12 U/L (ref 6–29)
AST: 13 U/L (ref 10–30)
Albumin: 4.5 g/dL (ref 3.6–5.1)
Alkaline Phosphatase: 50 U/L (ref 33–115)
BUN: 9 mg/dL (ref 7–25)
CO2: 27 mmol/L (ref 20–31)
Calcium: 9.4 mg/dL (ref 8.6–10.2)
Chloride: 104 mmol/L (ref 98–110)
Creat: 0.63 mg/dL (ref 0.50–1.10)
Glucose, Bld: 103 mg/dL — ABNORMAL HIGH (ref 65–99)
Sodium: 139 mmol/L (ref 135–146)
Total Bilirubin: 0.3 mg/dL (ref 0.2–1.2)

## 2015-06-12 LAB — SEDIMENTATION RATE: Sed Rate: 1 mm/hr (ref 0–20)

## 2015-06-12 LAB — LIPASE: Lipase: 21 U/L (ref 7–60)

## 2015-06-12 LAB — COMPREHENSIVE METABOLIC PANEL WITH GFR
Potassium: 4.2 mmol/L (ref 3.5–5.3)
Total Protein: 6.9 g/dL (ref 6.1–8.1)

## 2015-06-27 ENCOUNTER — Ambulatory Visit (INDEPENDENT_AMBULATORY_CARE_PROVIDER_SITE_OTHER): Payer: 59 | Admitting: Family Medicine

## 2015-06-27 VITALS — BP 112/72 | HR 83 | Temp 98.5°F | Resp 14 | Ht 59.0 in | Wt 229.0 lb

## 2015-06-27 DIAGNOSIS — R300 Dysuria: Secondary | ICD-10-CM

## 2015-06-27 DIAGNOSIS — N3090 Cystitis, unspecified without hematuria: Secondary | ICD-10-CM | POA: Diagnosis not present

## 2015-06-27 LAB — POCT URINALYSIS DIPSTICK
Glucose, UA: NEGATIVE
NITRITE UA: POSITIVE
PROTEIN UA: 100
RBC UA: NEGATIVE
Spec Grav, UA: 1.02
Urobilinogen, UA: 4
pH, UA: 5

## 2015-06-27 LAB — POCT UA - MICROSCOPIC ONLY
CASTS, UR, LPF, POC: NEGATIVE
Crystals, Ur, HPF, POC: NEGATIVE
Yeast, UA: NEGATIVE

## 2015-06-27 MED ORDER — SULFAMETHOXAZOLE-TRIMETHOPRIM 800-160 MG PO TABS: 1.0000 | ORAL_TABLET | Freq: Two times a day (BID) | ORAL | Status: DC

## 2015-06-27 NOTE — Patient Instructions (Signed)
Continue using the a zone if needed  Drink plenty of fluids  Take the sulfamethoxazole one pill twice daily with some food at breakfast and supper  Return if further symptoms, especially such as pain or fever or nausea or vomiting or urinating a lot of blood  Urinary Tract Infection Urinary tract infections (UTIs) can develop anywhere along your urinary tract. Your urinary tract is your body's drainage system for removing wastes and extra water. Your urinary tract includes two kidneys, two ureters, a bladder, and a urethra. Your kidneys are a pair of bean-shaped organs. Each kidney is about the size of your fist. They are located below your ribs, one on each side of your spine. CAUSES Infections are caused by microbes, which are microscopic organisms, including fungi, viruses, and bacteria. These organisms are so small that they can only be seen through a microscope. Bacteria are the microbes that most commonly cause UTIs. SYMPTOMS  Symptoms of UTIs may vary by age and gender of the patient and by the location of the infection. Symptoms in young women typically include a frequent and intense urge to urinate and a painful, burning feeling in the bladder or urethra during urination. Older women and men are more likely to be tired, shaky, and weak and have muscle aches and abdominal pain. A fever may mean the infection is in your kidneys. Other symptoms of a kidney infection include pain in your back or sides below the ribs, nausea, and vomiting. DIAGNOSIS To diagnose a UTI, your caregiver will ask you about your symptoms. Your caregiver also will ask to provide a urine sample. The urine sample will be tested for bacteria and white blood cells. White blood cells are made by your body to help fight infection. TREATMENT  Typically, UTIs can be treated with medication. Because most UTIs are caused by a bacterial infection, they usually can be treated with the use of antibiotics. The choice of antibiotic  and length of treatment depend on your symptoms and the type of bacteria causing your infection. HOME CARE INSTRUCTIONS  If you were prescribed antibiotics, take them exactly as your caregiver instructs you. Finish the medication even if you feel better after you have only taken some of the medication.  Drink enough water and fluids to keep your urine clear or pale yellow.  Avoid caffeine, tea, and carbonated beverages. They tend to irritate your bladder.  Empty your bladder often. Avoid holding urine for long periods of time.  Empty your bladder before and after sexual intercourse.  After a bowel movement, women should cleanse from front to back. Use each tissue only once. SEEK MEDICAL CARE IF:   You have back pain.  You develop a fever.  Your symptoms do not begin to resolve within 3 days. SEEK IMMEDIATE MEDICAL CARE IF:   You have severe back pain or lower abdominal pain.  You develop chills.  You have nausea or vomiting.  You have continued burning or discomfort with urination. MAKE SURE YOU:   Understand these instructions.  Will watch your condition.  Will get help right away if you are not doing well or get worse. Document Released: 08/01/2005 Document Revised: 04/22/2012 Document Reviewed: 11/30/2011 [facility] Patient Information 2015 [place], [place]. This information is not intended to replace advice given to you by your health care provider. Make sure you discuss any questions you have with your health care provider.

## 2015-06-27 NOTE — Progress Notes (Signed)
Subjective:  Patient ID: Morgan Ward, female    DOB: May 04, 1991  Age: 24 y.o. MRN: [id number]  24 year old lady who has been having dysuria for couple of days. She has a history of a lot of urinary tract infections. She took some Azo and caused her nausea because she took it on a piece stomach. She has had a little back pain     Objective:   No major distress. No CVA tenderness. Is a little tender right on the paraspinous muscle on the left. Her abdomen is soft without mass or tenderness.  Assessment & Plan:   Assessment:  Cystitis Dysuria Mild back pain  Plan:  Treat routinely. Instructed her to return if worse  Patient Instructions  Continue using the a zone if needed  Drink plenty of fluids  Take the sulfamethoxazole one pill twice daily with some food at breakfast and supper  Return if further symptoms, especially such as pain or fever or nausea or vomiting or urinating a lot of blood  Urinary Tract Infection Urinary tract infections (UTIs) can develop anywhere along your urinary tract. Your urinary tract is your body's drainage system for removing wastes and extra water. Your urinary tract includes two kidneys, two ureters, a bladder, and a urethra. Your kidneys are a pair of bean-shaped organs. Each kidney is about the size of your fist. They are located below your ribs, one on each side of your spine. CAUSES Infections are caused by microbes, which are microscopic organisms, including fungi, viruses, and bacteria. These organisms are so small that they can only be seen through a microscope. Bacteria are the microbes that most commonly cause UTIs. SYMPTOMS  Symptoms of UTIs may vary by age and gender of the patient and by the location of the infection. Symptoms in young women typically include a frequent and intense urge to urinate and a painful, burning feeling in the bladder or urethra during urination. Older women and men are more likely to be tired, shaky, and weak  and have muscle aches and abdominal pain. A fever may mean the infection is in your kidneys. Other symptoms of a kidney infection include pain in your back or sides below the ribs, nausea, and vomiting. DIAGNOSIS To diagnose a UTI, your caregiver will ask you about your symptoms. Your caregiver also will ask to provide a urine sample. The urine sample will be tested for bacteria and white blood cells. White blood cells are made by your body to help fight infection. TREATMENT  Typically, UTIs can be treated with medication. Because most UTIs are caused by a bacterial infection, they usually can be treated with the use of antibiotics. The choice of antibiotic and length of treatment depend on your symptoms and the type of bacteria causing your infection. HOME CARE INSTRUCTIONS  If you were prescribed antibiotics, take them exactly as your caregiver instructs you. Finish the medication even if you feel better after you have only taken some of the medication.  Drink enough water and fluids to keep your urine clear or pale yellow.  Avoid caffeine, tea, and carbonated beverages. They tend to irritate your bladder.  Empty your bladder often. Avoid holding urine for long periods of time.  Empty your bladder before and after sexual intercourse.  After a bowel movement, women should cleanse from front to back. Use each tissue only once. SEEK MEDICAL CARE IF:   You have back pain.  You develop a fever.  Your symptoms do not begin to resolve within 3  days. SEEK IMMEDIATE MEDICAL CARE IF:   You have severe back pain or lower abdominal pain.  You develop chills.  You have nausea or vomiting.  You have continued burning or discomfort with urination. MAKE SURE YOU:   Understand these instructions.  Will watch your condition.  Will get help right away if you are not doing well or get worse. Document Released: 08/01/2005 Document Revised: 04/22/2012 Document Reviewed: 11/30/2011 [facility]  Patient Information 2015 [place], [place]. This information is not intended to replace advice given to you by your health care provider. Make sure you discuss any questions you have with your health care provider.     Tiburcio Linder, MD 06/27/2015

## 2015-06-30 LAB — URINE CULTURE

## 2016-02-05 IMAGING — CR DG ABDOMEN ACUTE W/ 1V CHEST
5 series · 5 of 5 positions shown · non-contrast
Comparison: None.

CLINICAL DATA: Patient with abdominal pain.  Initial encounter.

EXAM:
DG ABDOMEN ACUTE W/ 1V CHEST

[PA]
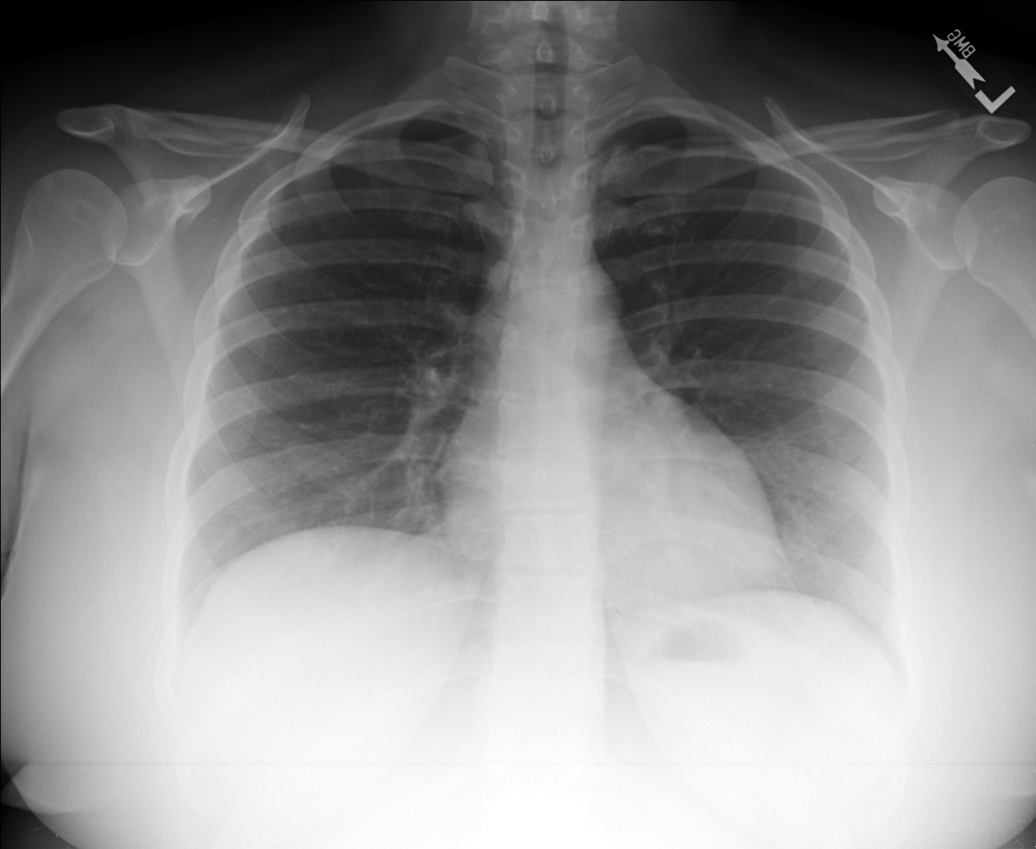

[AP (1 of 4)]
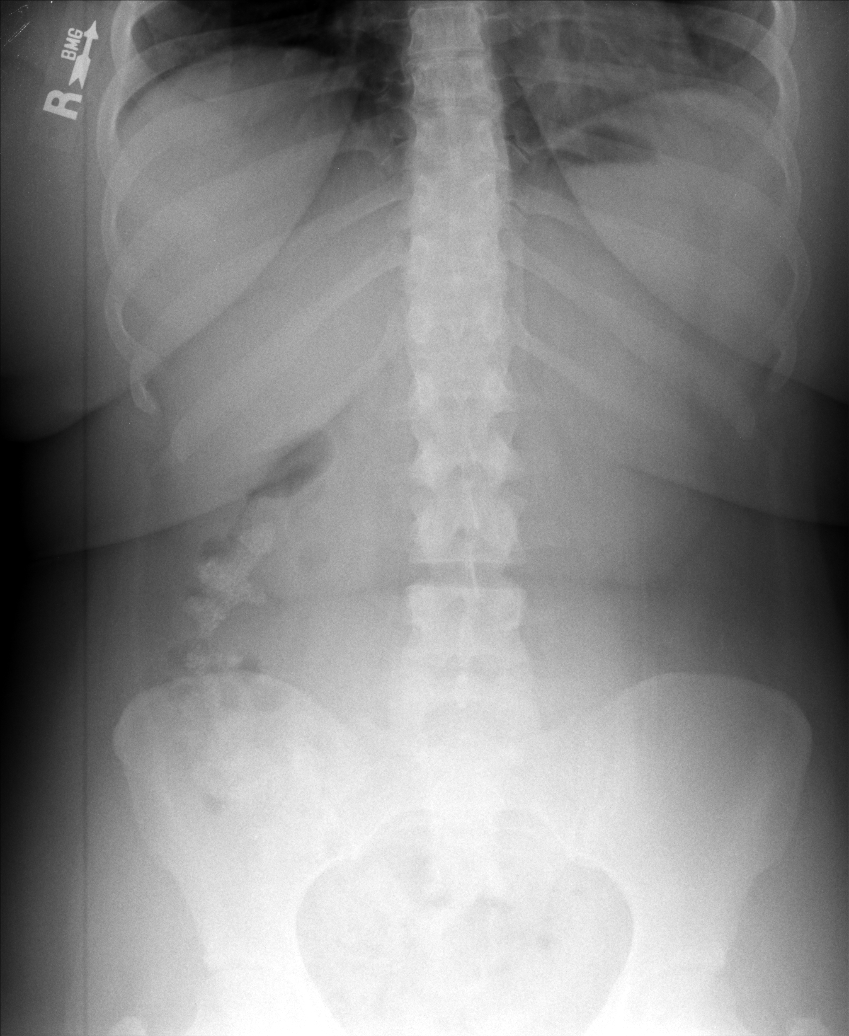

[AP (2 of 4)]
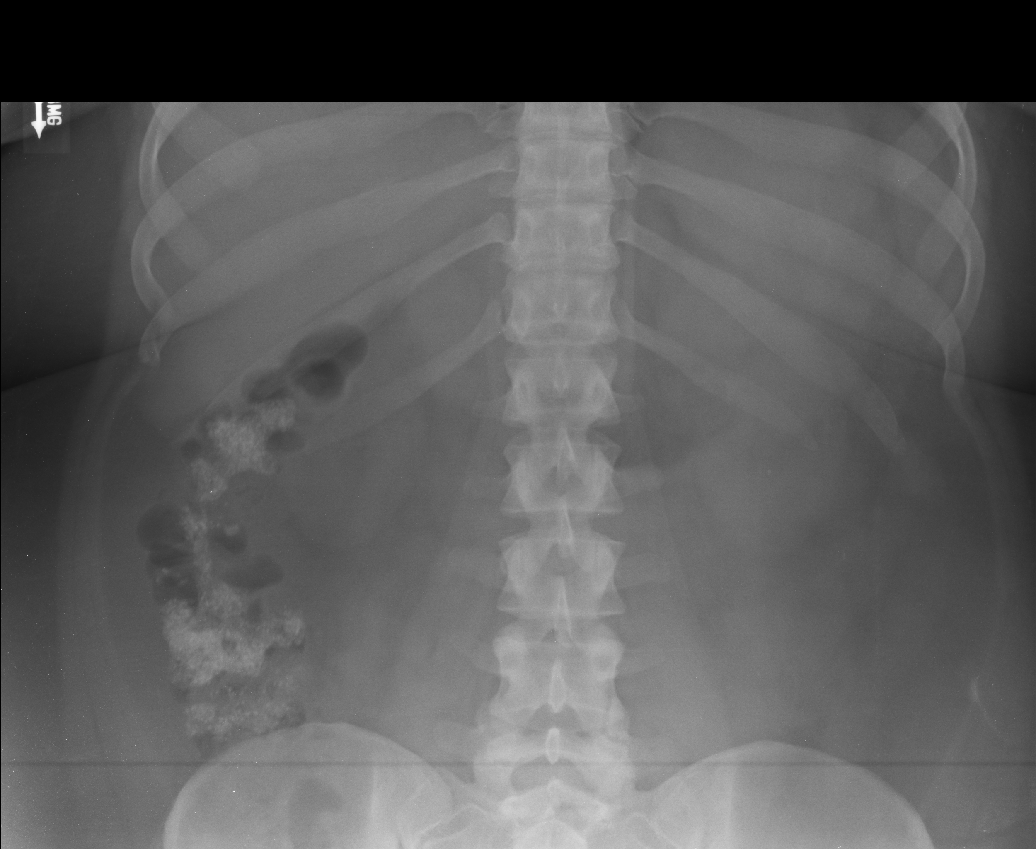

[AP (3 of 4)]
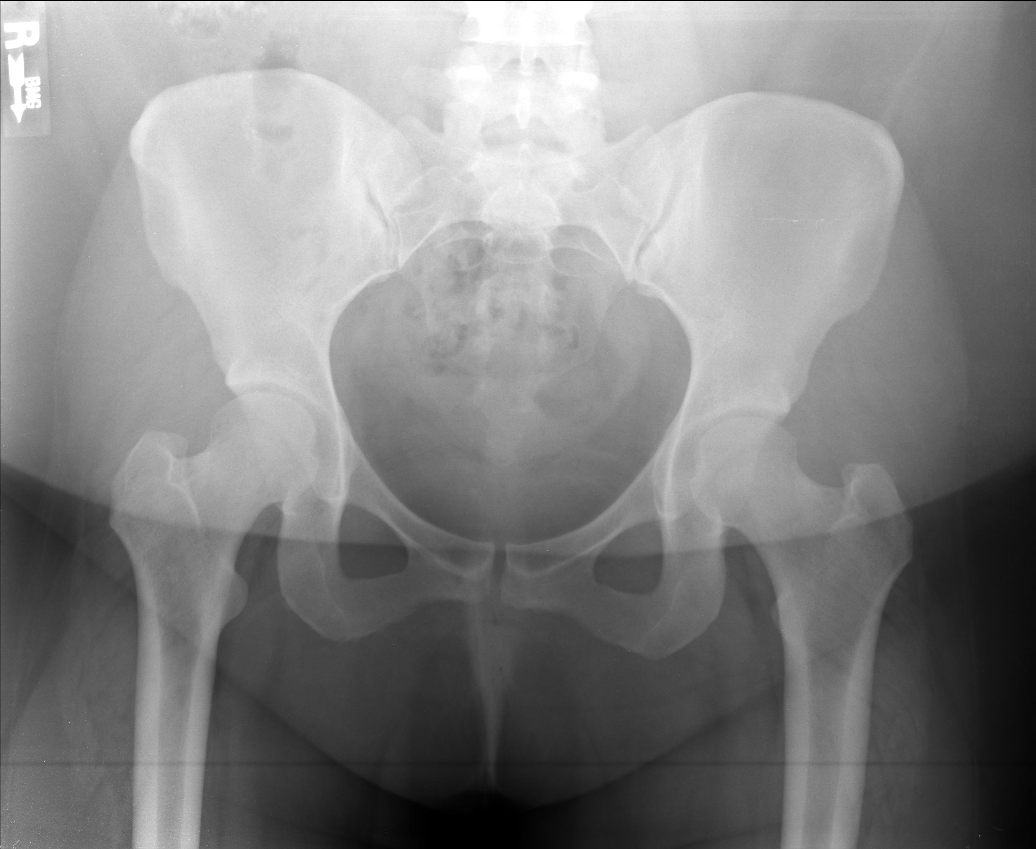

[AP (4 of 4)]
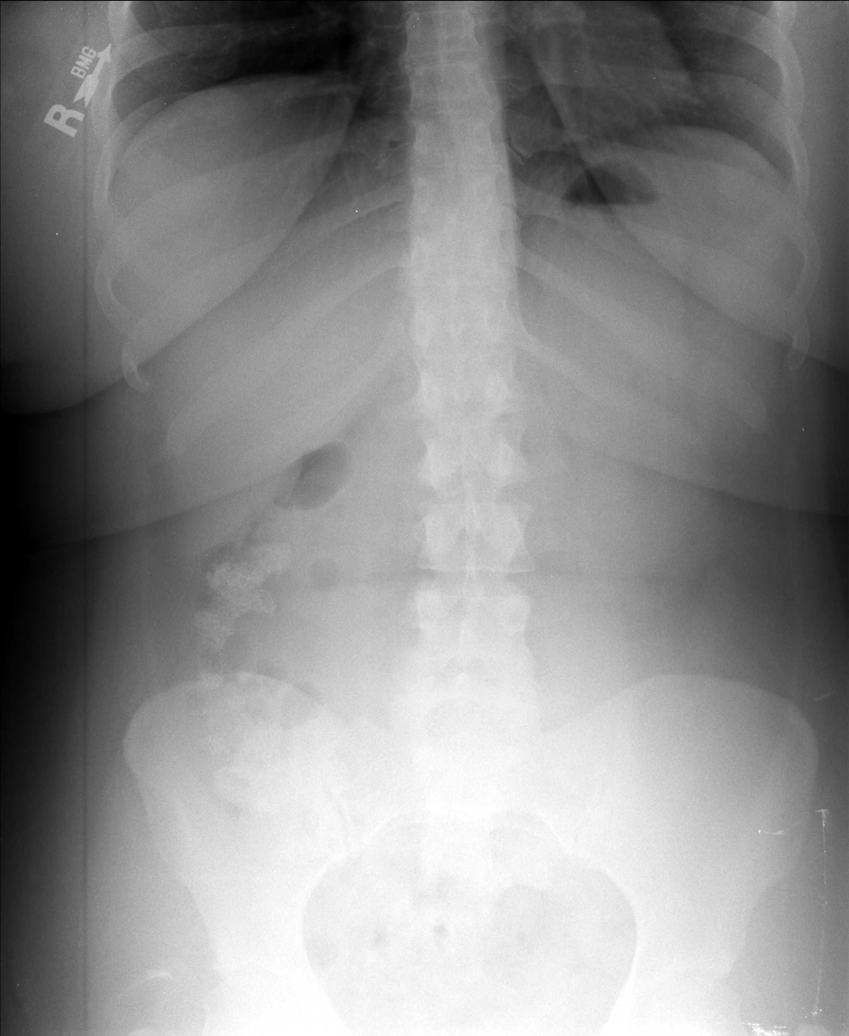

[5 of 5 positions shown; findings below may reference images not displayed]

FINDINGS: Normal cardiac and mediastinal contours. No consolidative pulmonary
opacities. No pleural effusion or pneumothorax. Regional skeleton is
unremarkable.

Enteric material demonstrated within the cecum and ascending colon.
Paucity of small bowel gas. No definite evidence for overt
obstruction. Upright images demonstrate no free intraperitoneal air.
IMPRESSION: Paucity of small bowel gas without definite evidence for overt
obstruction.

## 2016-04-25 LAB — OB RESULTS CONSOLE HIV ANTIBODY (ROUTINE TESTING): HIV: NONREACTIVE

## 2016-04-25 LAB — OB RESULTS CONSOLE HEPATITIS B SURFACE ANTIGEN: HEP B S AG: NEGATIVE

## 2016-04-25 LAB — OB RESULTS CONSOLE ABO/RH: RH Type: POSITIVE

## 2016-04-25 LAB — OB RESULTS CONSOLE GC/CHLAMYDIA
CHLAMYDIA, DNA PROBE: NEGATIVE
GC PROBE AMP, GENITAL: NEGATIVE

## 2016-04-25 LAB — OB RESULTS CONSOLE RPR: RPR: NONREACTIVE

## 2016-04-25 LAB — OB RESULTS CONSOLE RUBELLA ANTIBODY, IGM: RUBELLA: IMMUNE

## 2016-04-25 LAB — OB RESULTS CONSOLE ANTIBODY SCREEN: ANTIBODY SCREEN: NEGATIVE

## 2016-05-10 ENCOUNTER — Encounter (HOSPITAL_COMMUNITY): Payer: Self-pay

## 2016-05-10 ENCOUNTER — Inpatient Hospital Stay (HOSPITAL_COMMUNITY)
Admission: AD | Admit: 2016-05-10 | Discharge: 2016-05-11 | Disposition: A | Discharge status: Home / Self Care | Payer: Medicaid Other | Source: Ambulatory Visit | Attending: Obstetrics and Gynecology | Admitting: Obstetrics and Gynecology

## 2016-05-10 ENCOUNTER — Inpatient Hospital Stay (HOSPITAL_COMMUNITY): Payer: Medicaid Other

## 2016-05-10 DIAGNOSIS — Z888 Allergy status to other drugs, medicaments and biological substances status: Secondary | ICD-10-CM | POA: Diagnosis not present

## 2016-05-10 DIAGNOSIS — Z79899 Other long term (current) drug therapy: Secondary | ICD-10-CM | POA: Diagnosis not present

## 2016-05-10 DIAGNOSIS — O99342 Other mental disorders complicating pregnancy, second trimester: Secondary | ICD-10-CM | POA: Diagnosis not present

## 2016-05-10 DIAGNOSIS — F419 Anxiety disorder, unspecified: Secondary | ICD-10-CM | POA: Insufficient documentation

## 2016-05-10 DIAGNOSIS — E282 Polycystic ovarian syndrome: Secondary | ICD-10-CM | POA: Diagnosis not present

## 2016-05-10 DIAGNOSIS — O4691 Antepartum hemorrhage, unspecified, first trimester: Secondary | ICD-10-CM | POA: Diagnosis not present

## 2016-05-10 DIAGNOSIS — O209 Hemorrhage in early pregnancy, unspecified: Secondary | ICD-10-CM | POA: Diagnosis not present

## 2016-05-10 DIAGNOSIS — Z3A10 10 weeks gestation of pregnancy: Secondary | ICD-10-CM | POA: Diagnosis not present

## 2016-05-10 DIAGNOSIS — Z7951 Long term (current) use of inhaled steroids: Secondary | ICD-10-CM | POA: Diagnosis not present

## 2016-05-10 DIAGNOSIS — Z3490 Encounter for supervision of normal pregnancy, unspecified, unspecified trimester: Secondary | ICD-10-CM

## 2016-05-10 DIAGNOSIS — F329 Major depressive disorder, single episode, unspecified: Secondary | ICD-10-CM | POA: Diagnosis not present

## 2016-05-10 LAB — URINE MICROSCOPIC-ADD ON

## 2016-05-10 LAB — CBC
HEMATOCRIT: 34.2 % — AB (ref 36.0–46.0)
HEMOGLOBIN: 11.8 g/dL — AB (ref 12.0–15.0)
MCH: 29.7 pg (ref 26.0–34.0)
MCHC: 34.5 g/dL (ref 30.0–36.0)
MCV: 86.1 fL (ref 78.0–100.0)
Platelets: 308 10*3/uL (ref 150–400)
RBC: 3.97 MIL/uL (ref 3.87–5.11)
RDW: 13.1 % (ref 11.5–15.5)
WBC: 10.6 10*3/uL — ABNORMAL HIGH (ref 4.0–10.5)

## 2016-05-10 LAB — WET PREP, GENITAL
Clue Cells Wet Prep HPF POC: NONE SEEN
SPERM: NONE SEEN
Trich, Wet Prep: NONE SEEN
YEAST WET PREP: NONE SEEN

## 2016-05-10 LAB — URINALYSIS, ROUTINE W REFLEX MICROSCOPIC
Bilirubin Urine: NEGATIVE
Glucose, UA: NEGATIVE mg/dL
KETONES UR: 15 mg/dL — AB
LEUKOCYTES UA: NEGATIVE
NITRITE: NEGATIVE
PROTEIN: NEGATIVE mg/dL
Specific Gravity, Urine: 1.025 (ref 1.005–1.030)
pH: 6 (ref 5.0–8.0)

## 2016-05-10 LAB — POCT PREGNANCY, URINE: Preg Test, Ur: POSITIVE — AB

## 2016-05-10 NOTE — MAU Note (Signed)
Pt reports she has had vaginal pain for a few weeks, was seen in the office on Monday and everything was okay. Today she passed a large clot and started bleeding.

## 2016-05-10 NOTE — MAU Provider Note (Signed)
History     CSN: [id number]  Arrival date and time: 05/10/16 2157   First Provider Initiated Contact with Patient 05/10/16 2302      Chief Complaint  Patient presents with  . Vaginal Bleeding   Vaginal Bleeding The patient's primary symptoms include vaginal bleeding. Primary symptoms comment: vaginal pain. . This is a new problem. The current episode started today. The problem occurs constantly. The problem has been unchanged. Pain severity now: 4/10  The problem affects both sides. She is pregnant. Associated symptoms include dysuria (vaginal pain when urinating as well. ). Pertinent negatives include no abdominal pain, chills, constipation, diarrhea, fever, frequency, nausea, urgency or vomiting. The vaginal discharge was bloody. She has been passing clots (one clot. ). She has not been passing tissue. Nothing aggravates the symptoms. She has tried nothing for the symptoms. Sexual activity: No recent intercourse  Menstrual history: LMP 02/24/16      Past Medical History  Diagnosis Date  . Allergic rhinitis, cause unspecified   . PCOS (polycystic ovarian syndrome)   . Nephrolithiasis   . Depression   . Obesity   . History of angioedema   . Recurrent herpes labialis   . Anxiety   . Depression     Past Surgical History  Procedure Laterality Date  . Cesarean section      Family History  Problem Relation Age of Onset  . Diverticulitis Mother   . Diabetes Mother   . Hypertension Mother     Social History  Substance Use Topics  . Smoking status: Never Smoker   . Smokeless tobacco: None  . Alcohol Use: None    Allergies:  Allergies  Allergen Reactions  . Sertraline Hcl [Sertraline Hcl] Diarrhea, Nausea Only and Other (See Comments)    headache  . Wellbutrin [Bupropion Hcl] Itching    Prescriptions prior to admission  Medication Sig Dispense Refill Last Dose  . fluticasone (FLONASE) 50 MCG/ACT nasal spray Place 2 sprays into the nose daily. 16 g 6 Not Taking  .  sulfamethoxazole-trimethoprim (BACTRIM DS,SEPTRA DS) 800-160 MG per tablet Take 1 tablet by mouth 2 (two) times daily. 10 tablet 0     Review of Systems  Constitutional: Negative for fever and chills.  Gastrointestinal: Negative for nausea, vomiting, abdominal pain, diarrhea and constipation.  Genitourinary: Positive for dysuria (vaginal pain when urinating as well. ) and vaginal bleeding. Negative for urgency and frequency.   Physical Exam   Blood pressure 120/76, pulse 64, temperature 98.3 F (36.8 C), temperature source Oral, resp. rate 20, height 4\' 11"  (1.499 m), weight 99.791 kg (220 lb), last menstrual period 02/24/2016, SpO2 100 %.  Physical Exam  Nursing note and vitals reviewed. Constitutional: She is oriented to person, place, and time. She appears well-developed and well-nourished. No distress.  HENT:  Head: Normocephalic.  Cardiovascular: Normal rate.   Respiratory: Effort normal.  GI: Soft. There is no tenderness. There is no rebound.  Neurological: She is alert and oriented to person, place, and time.  Skin: Skin is warm and dry.  Psychiatric: She has a normal mood and affect.   Results for orders placed or performed during the hospital encounter of 05/10/16 (from the past 24 hour(s))  Urinalysis, Routine w reflex microscopic ([facility])     Status: Abnormal   Collection Time: 05/10/16 10:25 PM  Result Value Ref Range   Color, Urine YELLOW YELLOW   APPearance CLEAR CLEAR   Specific Gravity, Urine 1.025 1.005 - 1.030   pH 6.0  5.0 - 8.0   Glucose, UA NEGATIVE NEGATIVE mg/dL   Hgb urine dipstick LARGE (A) NEGATIVE   Bilirubin Urine NEGATIVE NEGATIVE   Ketones, ur 15 (A) NEGATIVE mg/dL   Protein, ur NEGATIVE NEGATIVE mg/dL   Nitrite NEGATIVE NEGATIVE   Leukocytes, UA NEGATIVE NEGATIVE  Urine microscopic-add on     Status: Abnormal   Collection Time: 05/10/16 10:25 PM  Result Value Ref Range   Squamous Epithelial / LPF 0-5 (A) NONE SEEN   WBC, UA 0-5 0 - 5  WBC/hpf   RBC / HPF 0-5 0 - 5 RBC/hpf   Bacteria, UA RARE (A) NONE SEEN   Urine-Other MUCOUS PRESENT   Pregnancy, urine POC     Status: Abnormal   Collection Time: 05/10/16 10:34 PM  Result Value Ref Range   Preg Test, Ur POSITIVE (A) NEGATIVE  CBC     Status: Abnormal   Collection Time: 05/10/16 11:12 PM  Result Value Ref Range   WBC 10.6 (H) 4.0 - 10.5 K/uL   RBC 3.97 3.87 - 5.11 MIL/uL   Hemoglobin 11.8 (L) 12.0 - 15.0 g/dL   HCT 16.134.2 (L) 09.636.0 - 04.546.0 %   MCV 86.1 78.0 - 100.0 fL   MCH 29.7 26.0 - 34.0 pg   MCHC 34.5 30.0 - 36.0 g/dL   RDW 40.913.1 81.111.5 - 91.415.5 %   Platelets 308 150 - 400 K/uL  ABO/Rh     Status: None (Preliminary result)   Collection Time: 05/10/16 11:12 PM  Result Value Ref Range   ABO/RH(D) A POS   Wet prep, genital     Status: Abnormal   Collection Time: 05/10/16 11:22 PM  Result Value Ref Range   Yeast Wet Prep HPF POC NONE SEEN NONE SEEN   Trich, Wet Prep NONE SEEN NONE SEEN   Clue Cells Wet Prep HPF POC NONE SEEN NONE SEEN   WBC, Wet Prep HPF POC MODERATE (A) NONE SEEN   Sperm NONE SEEN    [place] Ob Comp Less 14 Wks  05/10/2016  CLINICAL DATA:  Bleeding.  Irregular cycles.  Early pregnancy. EXAM: OBSTETRIC <14 WK US AND TRANSVAGINAL OB US TECHNIQUE: Both transabdominal and transvaginal ultrasound examinations were performed for complete evaluation of the gestation as well as the maternal uterus, adnexal regions, and pelvic cul-de-sac. Transvaginal technique was performed to assess early pregnancy. COMPARISON:  None. FINDINGS: Intrauterine gestational sac: Present Embryo:  Present Cardiac Activity: Present Heart Rate: 169  bpm CRL:  33.6  mm   10 w   2 d                  US EDC: 12/04/2016 Subchorionic hemorrhage:  None visualized. Maternal uterus/adnexae: Right ovary contains a 2.6 x 2.3 x 1.9 cm anechoic cyst. Left ovary was never visualized. No free fluid. IMPRESSION: Single living intrauterine pregnancy at 10 weeks 2 days by crown-rump length. No abnormality.  Electronically Signed   By: Paulina FusiMark  Shogry M.D.   On: 05/10/2016 23:55   [place] Ob Transvaginal  05/10/2016  CLINICAL DATA:  Bleeding.  Irregular cycles.  Early pregnancy. EXAM: OBSTETRIC <14 WK US AND TRANSVAGINAL OB US TECHNIQUE: Both transabdominal and transvaginal ultrasound examinations were performed for complete evaluation of the gestation as well as the maternal uterus, adnexal regions, and pelvic cul-de-sac. Transvaginal technique was performed to assess early pregnancy. COMPARISON:  None. FINDINGS: Intrauterine gestational sac: Present Embryo:  Present Cardiac Activity: Present Heart Rate: 169  bpm CRL:  33.6  mm  10 w   2 d                  [place] EDC: 12/04/2016 Subchorionic hemorrhage:  None visualized. Maternal uterus/adnexae: Right ovary contains a 2.6 x 2.3 x 1.9 cm anechoic cyst. Left ovary was never visualized. No free fluid. IMPRESSION: Single living intrauterine pregnancy at 10 weeks 2 days by crown-rump length. No abnormality. Electronically Signed   By: Paulina Fusi M.D.   On: 05/10/2016 23:55    MAU Course  Procedures  MDM   Assessment and Plan   1. Intrauterine pregnancy   2. Vaginal bleeding in pregnancy, first trimester   3. [redacted] weeks gestation of pregnancy    DC home Comfort measures reviewed  1st Trimester precautions  Bleeding precautions RX: none  Return to MAU as needed FU with OB as planned  Follow-up Information    Follow up with Turner Daniels, MD.   Specialty:  Obstetrics and Gynecology   Why:  As scheduled   Contact information:   [address] [place], [address] [place] [address] [phone]         Tawnya Crook 05/10/2016, 11:17 PM

## 2016-05-11 DIAGNOSIS — O4691 Antepartum hemorrhage, unspecified, first trimester: Secondary | ICD-10-CM

## 2016-05-11 LAB — ABO/RH: ABO/RH(D): A POS

## 2016-05-11 LAB — RPR: RPR: NONREACTIVE

## 2016-05-11 LAB — GC/CHLAMYDIA PROBE AMP (~~LOC~~) NOT AT ARMC
Chlamydia: NEGATIVE
Neisseria Gonorrhea: NEGATIVE

## 2016-05-11 LAB — HIV ANTIBODY (ROUTINE TESTING W REFLEX): HIV SCREEN 4TH GENERATION: NONREACTIVE

## 2016-05-11 LAB — HCG, QUANTITATIVE, PREGNANCY: HCG, BETA CHAIN, QUANT, S: 127193 m[IU]/mL — AB (ref <5)

## 2016-05-11 NOTE — Discharge Instructions (Signed)
First Trimester of Pregnancy The first trimester of pregnancy is from week 1 until the end of week 12 (months 1 through 3). A week after a sperm fertilizes an egg, the egg will implant on the wall of the uterus. This embryo will begin to develop into a baby. Genes from you and your partner are forming the baby. The female genes determine whether the baby is a boy or a girl. At 6-8 weeks, the eyes and face are formed, and the heartbeat can be seen on ultrasound. At the end of 12 weeks, all the baby's organs are formed.  Now that you are pregnant, you will want to do everything you can to have a healthy baby. Two of the most important things are to get good prenatal care and to follow your health care provider's instructions. Prenatal care is all the medical care you receive before the baby's birth. This care will help prevent, find, and treat any problems during the pregnancy and childbirth. BODY CHANGES Your body goes through many changes during pregnancy. The changes vary from woman to woman.   You may gain or lose a couple of pounds at first.  You may feel sick to your stomach (nauseous) and throw up (vomit). If the vomiting is uncontrollable, call your health care provider.  You may tire easily.  You may develop headaches that can be relieved by medicines approved by your health care provider.  You may urinate more often. Painful urination may mean you have a bladder infection.  You may develop heartburn as a result of your pregnancy.  You may develop constipation because certain hormones are causing the muscles that push waste through your intestines to slow down.  You may develop hemorrhoids or swollen, bulging veins (varicose veins).  Your breasts may begin to grow larger and become tender. Your nipples may stick out more, and the tissue that surrounds them (areola) may become darker.  Your gums may bleed and may be sensitive to brushing and flossing.  Dark spots or blotches (chloasma,  mask of pregnancy) may develop on your face. This will likely fade after the baby is born.  Your menstrual periods will stop.  You may have a loss of appetite.  You may develop cravings for certain kinds of food.  You may have changes in your emotions from day to day, such as being excited to be pregnant or being concerned that something may go wrong with the pregnancy and baby.  You may have more vivid and strange dreams.  You may have changes in your hair. These can include thickening of your hair, rapid growth, and changes in texture. Some women also have hair loss during or after pregnancy, or hair that feels dry or thin. Your hair will most likely return to normal after your baby is born. WHAT TO EXPECT AT YOUR PRENATAL VISITS During a routine prenatal visit:  You will be weighed to make sure you and the baby are growing normally.  Your blood pressure will be taken.  Your abdomen will be measured to track your baby's growth.  The fetal heartbeat will be listened to starting around week 10 or 12 of your pregnancy.  Test results from any previous visits will be discussed. Your health care provider may ask you:  How you are feeling.  If you are feeling the baby move.  If you have had any abnormal symptoms, such as leaking fluid, bleeding, severe headaches, or abdominal cramping.  If you are using any tobacco products,   including cigarettes, chewing tobacco, and electronic cigarettes.  If you have any questions. Other tests that may be performed during your first trimester include:  Blood tests to find your blood type and to check for the presence of any previous infections. They will also be used to check for low iron levels (anemia) and Rh antibodies. Later in the pregnancy, blood tests for diabetes will be done along with other tests if problems develop.  Urine tests to check for infections, diabetes, or protein in the urine.  An ultrasound to confirm the proper growth  and development of the baby.  An amniocentesis to check for possible genetic problems.  Fetal screens for spina bifida and Down syndrome.  You may need other tests to make sure you and the baby are doing well.  HIV (human immunodeficiency virus) testing. Routine prenatal testing includes screening for HIV, unless you choose not to have this test. HOME CARE INSTRUCTIONS  Medicines  Follow your health care provider's instructions regarding medicine use. Specific medicines may be either safe or unsafe to take during pregnancy.  Take your prenatal vitamins as directed.  If you develop constipation, try taking a stool softener if your health care provider approves. Diet  Eat regular, well-balanced meals. Choose a variety of foods, such as meat or vegetable-based protein, fish, milk and low-fat dairy products, vegetables, fruits, and whole grain breads and cereals. Your health care provider will help you determine the amount of weight gain that is right for you.  Avoid raw meat and uncooked cheese. These carry germs that can cause birth defects in the baby.  Eating four or five small meals rather than three large meals a day may help relieve nausea and vomiting. If you start to feel nauseous, eating a few soda crackers can be helpful. Drinking liquids between meals instead of during meals also seems to help nausea and vomiting.  If you develop constipation, eat more high-fiber foods, such as fresh vegetables or fruit and whole grains. Drink enough fluids to keep your urine clear or pale yellow. Activity and Exercise  Exercise only as directed by your health care provider. Exercising will help you:  Control your weight.  Stay in shape.  Be prepared for labor and delivery.  Experiencing pain or cramping in the lower abdomen or low back is a good sign that you should stop exercising. Check with your health care provider before continuing normal exercises.  Try to avoid standing for long  periods of time. Move your legs often if you must stand in one place for a long time.  Avoid heavy lifting.  Wear low-heeled shoes, and practice good posture.  You may continue to have sex unless your health care provider directs you otherwise. Relief of Pain or Discomfort  Wear a good support bra for breast tenderness.   Take warm sitz baths to soothe any pain or discomfort caused by hemorrhoids. Use hemorrhoid cream if your health care provider approves.   Rest with your legs elevated if you have leg cramps or low back pain.  If you develop varicose veins in your legs, wear support hose. Elevate your feet for 15 minutes, 3-4 times a day. Limit salt in your diet. Prenatal Care  Schedule your prenatal visits by the twelfth week of pregnancy. They are usually scheduled monthly at first, then more often in the last 2 months before delivery.  Write down your questions. Take them to your prenatal visits.  Keep all your prenatal visits as directed by your   health care provider. Safety  Wear your seat belt at all times when driving.  Make a list of emergency phone numbers, including numbers for family, friends, the hospital, and police and fire departments. General Tips  Ask your health care provider for a referral to a local prenatal education class. Begin classes no later than at the beginning of month 6 of your pregnancy.  Ask for help if you have counseling or nutritional needs during pregnancy. Your health care provider can offer advice or refer you to specialists for help with various needs.  Do not use hot tubs, steam rooms, or saunas.  Do not douche or use tampons or scented sanitary pads.  Do not cross your legs for long periods of time.  Avoid cat litter boxes and soil used by cats. These carry germs that can cause birth defects in the baby and possibly loss of the fetus by miscarriage or stillbirth.  Avoid all smoking, herbs, alcohol, and medicines not prescribed by  your health care provider. Chemicals in these affect the formation and growth of the baby.  Do not use any tobacco products, including cigarettes, chewing tobacco, and electronic cigarettes. If you need help quitting, ask your health care provider. You may receive counseling support and other resources to help you quit.  Schedule a dentist appointment. At home, brush your teeth with a soft toothbrush and be gentle when you floss. SEEK MEDICAL CARE IF:   You have dizziness.  You have mild pelvic cramps, pelvic pressure, or nagging pain in the abdominal area.  You have persistent nausea, vomiting, or diarrhea.  You have a bad smelling vaginal discharge.  You have pain with urination.  You notice increased swelling in your face, hands, legs, or ankles. SEEK IMMEDIATE MEDICAL CARE IF:   You have a fever.  You are leaking fluid from your vagina.  You have spotting or bleeding from your vagina.  You have severe abdominal cramping or pain.  You have rapid weight gain or loss.  You vomit blood or material that looks like coffee grounds.  You are exposed to German measles and have never had them.  You are exposed to fifth disease or chickenpox.  You develop a severe headache.  You have shortness of breath.  You have any kind of trauma, such as from a fall or a car accident.   This information is not intended to replace advice given to you by your health care provider. Make sure you discuss any questions you have with your health care provider.   Document Released: 10/16/2001 Document Revised: 11/12/2014 Document Reviewed: 09/01/2013 Elsevier Interactive Patient Education 2016 [employer]  

## 2016-11-12 ENCOUNTER — Encounter (HOSPITAL_COMMUNITY): Payer: Self-pay

## 2016-11-12 NOTE — H&P (Addendum)
Morgan Ward is a 26 y.o. female presenting for repeat c-section. OB History    Gravida Para Term Preterm AB Living   2 1 1     1    SAB TAB Ectopic Multiple Live Births           1     Past Medical History:  Diagnosis Date  . Allergic rhinitis, cause unspecified   . Anxiety   . Depression   . Depression   . History of angioedema   . Nephrolithiasis   . Obesity   . PCOS (polycystic ovarian syndrome)   . Recurrent herpes labialis    Past Surgical History:  Procedure Laterality Date  . CESAREAN SECTION     Family History: family history includes Diabetes in her mother; Diverticulitis in her mother; Hypertension in her mother. Social History:  reports that she has never smoked. She does not have any smokeless tobacco history on file. Her alcohol and drug histories are not on file.     Maternal Diabetes: No Genetic Screening: Declined Maternal Ultrasounds/Referrals: Normal Fetal Ultrasounds or other Referrals:  None Maternal Substance Abuse:  No Significant Maternal Medications:  None Significant Maternal Lab Results:  None Other Comments:  None  ROS History   Last menstrual period 02/24/2016. Exam Physical Exam  Gen - NAD ABd - gravid, NT Ext - NT, no edema Prenatal labs: ABO, Rh: --/--/A POS (07/06 2312) Antibody:   Rubella:   RPR: Non Reactive (07/06 2312)  HBsAg:    HIV: Non Reactive (07/06 2312)  GBS:     Assessment/Plan: Repeat c-section  R/b/a discussed, questions answered, informed consent  Morgan Ward 11/12/2016, 9:15 AM

## 2016-11-13 ENCOUNTER — Encounter (HOSPITAL_COMMUNITY): Payer: Self-pay

## 2016-11-22 ENCOUNTER — Encounter (HOSPITAL_COMMUNITY): Payer: Self-pay | Admitting: Anesthesiology

## 2016-11-22 ENCOUNTER — Encounter (HOSPITAL_COMMUNITY)
Admission: RE | Admit: 2016-11-22 | Discharge: 2016-11-22 | Disposition: A | Discharge status: Home / Self Care | Payer: 59 | Source: Ambulatory Visit | Attending: Obstetrics and Gynecology | Admitting: Obstetrics and Gynecology

## 2016-11-22 LAB — CBC
HCT: 32.5 % — ABNORMAL LOW (ref 36.0–46.0)
Hemoglobin: 10.9 g/dL — ABNORMAL LOW (ref 12.0–15.0)
MCH: 27.8 pg (ref 26.0–34.0)
MCHC: 33.5 g/dL (ref 30.0–36.0)
MCV: 82.9 fL (ref 78.0–100.0)
PLATELETS: 278 10*3/uL (ref 150–400)
RBC: 3.92 MIL/uL (ref 3.87–5.11)
RDW: 14 % (ref 11.5–15.5)
WBC: 10.8 10*3/uL — ABNORMAL HIGH (ref 4.0–10.5)

## 2016-11-22 NOTE — Patient Instructions (Signed)
18 Morgan Ward  11/22/2016   Your procedure is scheduled on:  11/23/2016  [facility]30 AM.  Pick up the phone at the desk and dial [phone].   Call this number if you have problems the morning of surgery: [phone]   Remember:   Do not eat food:After Midnight.  Do not drink clear liquids: After Midnight.  Take these medicines the morning of surgery with A SIP OF WATER: none   Do not wear jewelry, make-up or nail polish.  Do not wear lotions, powders, or perfumes. Do not wear deodorant.  Do not shave 48 hours prior to surgery.  Do not bring valuables to the hospital.  [facility] is not   responsible for any belongings or valuables brought to the hospital.  Contacts, dentures or bridgework may not be worn into surgery.  Leave suitcase in the car. After surgery it may be brought to your room.  For patients admitted to the hospital, checkout time is 11:00 AM the day of              discharge.   Patients discharged the day of surgery will not be allowed to drive             home.  Name and phone number of your driver: na  Special Instructions:   N/A   Please read over the following fact sheets that you were given:   Surgical Site Infection Prevention

## 2016-11-22 NOTE — Anesthesia Preprocedure Evaluation (Addendum)
Anesthesia Evaluation  Patient identified by MRN, date of birth, ID band Patient awake    Reviewed: Allergy & Precautions, NPO status , Patient's Chart, lab work & pertinent test results  Airway Mallampati: III       Dental no notable dental hx. (+) Teeth Intact   Pulmonary neg pulmonary ROS,    Pulmonary exam normal breath sounds clear to auscultation       Cardiovascular Normal cardiovascular exam Rhythm:Regular Rate:Normal     Neuro/Psych PSYCHIATRIC DISORDERS Anxiety Depression negative neurological ROS     GI/Hepatic negative GI ROS,   Endo/Other  Morbid obesityPCOS  Renal/GU Renal diseaseHx/o Renal calculi  negative genitourinary   Musculoskeletal   Abdominal (+) + obese,   Peds  Hematology Hx/o angioedema   Anesthesia Other Findings   Reproductive/Obstetrics (+) Pregnancy Previous C/Section HSV                            Anesthesia Physical Anesthesia Plan  ASA: III  Anesthesia Plan: Spinal   Post-op Pain Management:    Induction:   Airway Management Planned: Natural Airway  Additional Equipment:   Intra-op Plan: Utilization of Controlled Hypotension per surrgeon request  Post-operative Plan:   Informed Consent: I have reviewed the patients History and Physical, chart, labs and discussed the procedure including the risks, benefits and alternatives for the proposed anesthesia with the patient or authorized representative who has indicated his/her understanding and acceptance.   Dental advisory given  Plan Discussed with: CRNA, Surgeon and Anesthesiologist  Anesthesia Plan Comments:        Anesthesia Quick Evaluation

## 2016-11-23 ENCOUNTER — Inpatient Hospital Stay (HOSPITAL_COMMUNITY): Payer: 59 | Admitting: Anesthesiology

## 2016-11-23 ENCOUNTER — Inpatient Hospital Stay (HOSPITAL_COMMUNITY)
Admission: RE | Admit: 2016-11-23 | Discharge: 2016-11-25 | DRG: 765 | Disposition: A | Discharge status: Home / Self Care | Payer: 59 | Source: Ambulatory Visit | Attending: Obstetrics and Gynecology | Admitting: Obstetrics and Gynecology

## 2016-11-23 ENCOUNTER — Encounter (HOSPITAL_COMMUNITY): Payer: Self-pay

## 2016-11-23 ENCOUNTER — Encounter (HOSPITAL_COMMUNITY)
Admission: RE | Disposition: A | Discharge status: Home / Self Care | Payer: Self-pay | Source: Ambulatory Visit | Attending: Obstetrics and Gynecology

## 2016-11-23 DIAGNOSIS — E282 Polycystic ovarian syndrome: Secondary | ICD-10-CM | POA: Diagnosis present

## 2016-11-23 DIAGNOSIS — O99214 Obesity complicating childbirth: Secondary | ICD-10-CM | POA: Diagnosis present

## 2016-11-23 DIAGNOSIS — Z3A39 39 weeks gestation of pregnancy: Secondary | ICD-10-CM

## 2016-11-23 DIAGNOSIS — Z833 Family history of diabetes mellitus: Secondary | ICD-10-CM

## 2016-11-23 DIAGNOSIS — Z6841 Body Mass Index (BMI) 40.0 and over, adult: Secondary | ICD-10-CM

## 2016-11-23 DIAGNOSIS — O34211 Maternal care for low transverse scar from previous cesarean delivery: Principal | ICD-10-CM | POA: Diagnosis present

## 2016-11-23 DIAGNOSIS — Z8249 Family history of ischemic heart disease and other diseases of the circulatory system: Secondary | ICD-10-CM | POA: Diagnosis not present

## 2016-11-23 DIAGNOSIS — Z98891 History of uterine scar from previous surgery: Secondary | ICD-10-CM

## 2016-11-23 LAB — RPR: RPR Ser Ql: NONREACTIVE

## 2016-11-23 LAB — PREPARE RBC (CROSSMATCH)

## 2016-11-23 SURGERY — Surgical Case
Anesthesia: Spinal

## 2016-11-23 MED ORDER — LACTATED RINGERS IV SOLN
INTRAVENOUS | Status: DC
  Administered 2016-11-23: 08:00:00 via INTRAVENOUS

## 2016-11-23 MED ORDER — WITCH HAZEL-GLYCERIN EX PADS: 1.0000 "application " | MEDICATED_PAD | CUTANEOUS | Status: DC | PRN

## 2016-11-23 MED ORDER — TETANUS-DIPHTH-ACELL PERTUSSIS 5-2.5-18.5 LF-MCG/0.5 IM SUSP: 0.5000 mL | Freq: Once | INTRAMUSCULAR | Status: DC

## 2016-11-23 MED ORDER — ONDANSETRON HCL 4 MG/2ML IJ SOLN
INTRAMUSCULAR | Status: AC
  Filled 2016-11-23: qty 2

## 2016-11-23 MED ORDER — SENNOSIDES-DOCUSATE SODIUM 8.6-50 MG PO TABS
2.0000 | ORAL_TABLET | ORAL | Status: DC
  Administered 2016-11-23 – 2016-11-24 (×2): 2 via ORAL
  Filled 2016-11-23 (×2): qty 2

## 2016-11-23 MED ORDER — OXYCODONE-ACETAMINOPHEN 5-325 MG PO TABS
2.0000 | ORAL_TABLET | ORAL | Status: DC | PRN
  Filled 2016-11-23: qty 2

## 2016-11-23 MED ORDER — SIMETHICONE 80 MG PO CHEW
80.0000 mg | CHEWABLE_TABLET | Freq: Three times a day (TID) | ORAL | Status: DC
  Administered 2016-11-23 – 2016-11-25 (×5): 80 mg via ORAL
  Filled 2016-11-23 (×6): qty 1

## 2016-11-23 MED ORDER — MENTHOL 3 MG MT LOZG: 1.0000 | LOZENGE | OROMUCOSAL | Status: DC | PRN

## 2016-11-23 MED ORDER — FENTANYL CITRATE (PF) 100 MCG/2ML IJ SOLN
INTRAMUSCULAR | Status: DC | PRN
  Administered 2016-11-23: 20 ug via INTRATHECAL

## 2016-11-23 MED ORDER — MORPHINE SULFATE (PF) 0.5 MG/ML IJ SOLN
INTRAMUSCULAR | Status: DC | PRN
  Administered 2016-11-23: .2 mg via INTRATHECAL

## 2016-11-23 MED ORDER — PROMETHAZINE HCL 25 MG/ML IJ SOLN
6.2500 mg | Freq: Once | INTRAMUSCULAR | Status: AC
  Administered 2016-11-23: 6.25 mg via INTRAVENOUS
  Filled 2016-11-23: qty 1

## 2016-11-23 MED ORDER — EPHEDRINE SULFATE 50 MG/ML IJ SOLN
INTRAMUSCULAR | Status: DC | PRN
  Administered 2016-11-23: 5 mg via INTRAVENOUS

## 2016-11-23 MED ORDER — FENTANYL CITRATE (PF) 100 MCG/2ML IJ SOLN: 25.0000 ug | INTRAMUSCULAR | Status: DC | PRN

## 2016-11-23 MED ORDER — DIPHENHYDRAMINE HCL 50 MG/ML IJ SOLN
INTRAMUSCULAR | Status: DC | PRN
  Administered 2016-11-23: 12.5 mg via INTRAVENOUS

## 2016-11-23 MED ORDER — SIMETHICONE 80 MG PO CHEW
80.0000 mg | CHEWABLE_TABLET | ORAL | Status: DC
  Administered 2016-11-23 – 2016-11-24 (×2): 80 mg via ORAL
  Filled 2016-11-23: qty 1

## 2016-11-23 MED ORDER — LACTATED RINGERS IV SOLN
INTRAVENOUS | Status: DC | PRN
  Administered 2016-11-23 (×3): via INTRAVENOUS

## 2016-11-23 MED ORDER — ONDANSETRON HCL 4 MG/2ML IJ SOLN
INTRAMUSCULAR | Status: DC | PRN
  Administered 2016-11-23: 4 mg via INTRAVENOUS

## 2016-11-23 MED ORDER — SOD CITRATE-CITRIC ACID 500-334 MG/5ML PO SOLN
ORAL | Status: AC
  Filled 2016-11-23: qty 15

## 2016-11-23 MED ORDER — OXYTOCIN 40 UNITS IN LACTATED RINGERS INFUSION - SIMPLE MED: 2.5000 [IU]/h | INTRAVENOUS | Status: AC

## 2016-11-23 MED ORDER — PHENYLEPHRINE 8 MG IN D5W 100 ML (0.08MG/ML) PREMIX OPTIME
INJECTION | INTRAVENOUS | Status: DC | PRN
  Administered 2016-11-23: 60 ug/min via INTRAVENOUS

## 2016-11-23 MED ORDER — LACTATED RINGERS IV SOLN
INTRAVENOUS | Status: DC | PRN
  Administered 2016-11-23: 40 [IU] via INTRAVENOUS

## 2016-11-23 MED ORDER — DIPHENHYDRAMINE HCL 25 MG PO CAPS: 25.0000 mg | ORAL_CAPSULE | Freq: Four times a day (QID) | ORAL | Status: DC | PRN

## 2016-11-23 MED ORDER — EPHEDRINE 5 MG/ML INJ
INTRAVENOUS | Status: AC
  Filled 2016-11-23: qty 10

## 2016-11-23 MED ORDER — IBUPROFEN 600 MG PO TABS
600.0000 mg | ORAL_TABLET | Freq: Four times a day (QID) | ORAL | Status: DC
  Administered 2016-11-23 – 2016-11-25 (×7): 600 mg via ORAL
  Filled 2016-11-23 (×8): qty 1

## 2016-11-23 MED ORDER — DIBUCAINE 1 % RE OINT: 1.0000 "application " | TOPICAL_OINTMENT | RECTAL | Status: DC | PRN

## 2016-11-23 MED ORDER — SODIUM CHLORIDE 0.9 % IR SOLN
Status: DC | PRN
  Administered 2016-11-23: 1

## 2016-11-23 MED ORDER — DIPHENHYDRAMINE HCL 50 MG/ML IJ SOLN
INTRAMUSCULAR | Status: AC
  Filled 2016-11-23: qty 1

## 2016-11-23 MED ORDER — MEASLES, MUMPS & RUBELLA VAC ~~LOC~~ INJ: 0.5000 mL | INJECTION | Freq: Once | SUBCUTANEOUS | Status: DC

## 2016-11-23 MED ORDER — MEDROXYPROGESTERONE ACETATE 150 MG/ML IM SUSP: 150.0000 mg | INTRAMUSCULAR | Status: DC | PRN

## 2016-11-23 MED ORDER — CEFAZOLIN SODIUM-DEXTROSE 2-4 GM/100ML-% IV SOLN
2.0000 g | INTRAVENOUS | Status: AC
  Administered 2016-11-23: 2 g via INTRAVENOUS

## 2016-11-23 MED ORDER — DEXTROSE IN LACTATED RINGERS 5 % IV SOLN
INTRAVENOUS | Status: DC
  Administered 2016-11-23: 20:00:00 via INTRAVENOUS

## 2016-11-23 MED ORDER — FENTANYL CITRATE (PF) 100 MCG/2ML IJ SOLN
INTRAMUSCULAR | Status: AC
  Filled 2016-11-23: qty 2

## 2016-11-23 MED ORDER — OXYTOCIN 10 UNIT/ML IJ SOLN
INTRAMUSCULAR | Status: AC
  Filled 2016-11-23: qty 4

## 2016-11-23 MED ORDER — SIMETHICONE 80 MG PO CHEW: 80.0000 mg | CHEWABLE_TABLET | ORAL | Status: DC | PRN

## 2016-11-23 MED ORDER — PRENATAL MULTIVITAMIN CH
1.0000 | ORAL_TABLET | Freq: Every day | ORAL | Status: DC
  Administered 2016-11-24: 1 via ORAL
  Filled 2016-11-23 (×2): qty 1

## 2016-11-23 MED ORDER — SOD CITRATE-CITRIC ACID 500-334 MG/5ML PO SOLN
30.0000 mL | Freq: Once | ORAL | Status: AC
  Administered 2016-11-23: 30 mL via ORAL

## 2016-11-23 MED ORDER — COCONUT OIL OIL: 1.0000 "application " | TOPICAL_OIL | Status: DC | PRN

## 2016-11-23 MED ORDER — ACETAMINOPHEN 325 MG PO TABS
650.0000 mg | ORAL_TABLET | ORAL | Status: DC | PRN
  Administered 2016-11-23: 650 mg via ORAL
  Filled 2016-11-23: qty 2

## 2016-11-23 MED ORDER — MORPHINE SULFATE (PF) 0.5 MG/ML IJ SOLN
INTRAMUSCULAR | Status: AC
  Filled 2016-11-23: qty 10

## 2016-11-23 MED ORDER — BUPIVACAINE IN DEXTROSE 0.75-8.25 % IT SOLN
INTRATHECAL | Status: DC | PRN
  Administered 2016-11-23: 1.2 mL via INTRATHECAL

## 2016-11-23 MED ORDER — PHENYLEPHRINE 8 MG IN D5W 100 ML (0.08MG/ML) PREMIX OPTIME
INJECTION | INTRAVENOUS | Status: AC
  Filled 2016-11-23: qty 100

## 2016-11-23 MED ORDER — OXYCODONE-ACETAMINOPHEN 5-325 MG PO TABS
1.0000 | ORAL_TABLET | ORAL | Status: DC | PRN
  Administered 2016-11-24 – 2016-11-25 (×4): 1 via ORAL
  Filled 2016-11-23 (×3): qty 1

## 2016-11-23 SURGICAL SUPPLY — 33 items
ADH SKN CLS APL DERMABOND .7 (GAUZE/BANDAGES/DRESSINGS) ×2
CHLORAPREP W/TINT 26ML (MISCELLANEOUS) ×3 IMPLANT
CLAMP CORD UMBIL (MISCELLANEOUS) IMPLANT
CLOTH BEACON ORANGE TIMEOUT ST (SAFETY) ×3 IMPLANT
DERMABOND ADVANCED (GAUZE/BANDAGES/DRESSINGS) ×4
DERMABOND ADVANCED .7 DNX12 (GAUZE/BANDAGES/DRESSINGS) ×2 IMPLANT
DRSG OPSITE POSTOP 4X10 (GAUZE/BANDAGES/DRESSINGS) ×3 IMPLANT
DRSG OPSITE POSTOP 4X12 (GAUZE/BANDAGES/DRESSINGS) ×3 IMPLANT
ELECT REM PT RETURN 9FT ADLT (ELECTROSURGICAL) ×3
ELECTRODE REM PT RTRN 9FT ADLT (ELECTROSURGICAL) ×1 IMPLANT
EXTRACTOR VACUUM M CUP 4 TUBE (SUCTIONS) IMPLANT
EXTRACTOR VACUUM M CUP 4' TUBE (SUCTIONS)
GLOVE BIO SURGEON STRL SZ 6.5 (GLOVE) ×2 IMPLANT
GLOVE BIO SURGEONS STRL SZ 6.5 (GLOVE) ×1
GLOVE BIOGEL PI IND STRL 7.0 (GLOVE) ×2 IMPLANT
GLOVE BIOGEL PI INDICATOR 7.0 (GLOVE) ×4
GOWN STRL REUS W/TWL LRG LVL3 (GOWN DISPOSABLE) ×6 IMPLANT
KIT ABG SYR 3ML LUER SLIP (SYRINGE) IMPLANT
NEEDLE HYPO 25X5/8 SAFETYGLIDE (NEEDLE) IMPLANT
NS IRRIG 1000ML POUR BTL (IV SOLUTION) ×3 IMPLANT
PACK C SECTION WH (CUSTOM PROCEDURE TRAY) ×3 IMPLANT
PAD OB MATERNITY 4.3X12.25 (PERSONAL CARE ITEMS) ×3 IMPLANT
PENCIL SMOKE EVAC W/HOLSTER (ELECTROSURGICAL) ×3 IMPLANT
RETAINER VISCERAL (MISCELLANEOUS) ×3 IMPLANT
SUT CHROMIC 0 CT 802H (SUTURE) IMPLANT
SUT CHROMIC 0 CTX 36 (SUTURE) ×9 IMPLANT
SUT MON AB-0 CT1 36 (SUTURE) ×3 IMPLANT
SUT PDS AB 0 CTX 60 (SUTURE) ×3 IMPLANT
SUT PLAIN 0 NONE (SUTURE) IMPLANT
SUT VIC AB 4-0 KS 27 (SUTURE) IMPLANT
SYR BULB 3OZ (MISCELLANEOUS) ×3 IMPLANT
TOWEL OR 17X24 6PK STRL BLUE (TOWEL DISPOSABLE) ×3 IMPLANT
TRAY FOLEY CATH SILVER 14FR (SET/KITS/TRAYS/PACK) IMPLANT

## 2016-11-23 NOTE — Transfer of Care (Signed)
Immediate Anesthesia Transfer of Care Note  Patient: Morgan Ward  Procedure(s) Performed: Procedure(s) with comments: CESAREAN SECTION (N/A) - Repeat edc 11/30/16 nkda need RNFA  Patient Location: PACU  Anesthesia Type:Spinal  Level of Consciousness: awake  Airway & Oxygen Therapy: Patient Spontanous Breathing  Post-op Assessment: Report given to RN and Post -op Vital signs reviewed and stable  Post vital signs: Reviewed  Last Vitals:  Vitals:   11/23/16 0608  BP: 129/70  Pulse: 91  Resp: 20  Temp: 36.5 C    Last Pain:  Vitals:   11/23/16 0608  TempSrc: Oral         Complications: No apparent anesthesia complications

## 2016-11-23 NOTE — Anesthesia Postprocedure Evaluation (Signed)
Anesthesia Post Note  Patient: Morgan Ward  Procedure(s) Performed: Procedure(s) (LRB): CESAREAN SECTION (N/A)  Patient location during evaluation: Mother Baby Anesthesia Type: Spinal Level of consciousness: awake and alert Pain management: pain level controlled Vital Signs Assessment: post-procedure vital signs reviewed and stable Respiratory status: spontaneous breathing Cardiovascular status: stable Postop Assessment: no headache, no backache, spinal receding, no signs of nausea or vomiting, patient able to bend at knees and adequate PO intake Anesthetic complications: no        Last Vitals:  Vitals:   11/23/16 1300 11/23/16 1400  BP: 110/61 (!) 106/58  Pulse: 71 82  Resp: 20 20  Temp: 36.9 C     Last Pain:  Vitals:   11/23/16 1400  TempSrc:   PainSc: 2    Pain Goal: Patients Stated Pain Goal: 3 (11/23/16 1040)               Eileene Kisling Hristova

## 2016-11-23 NOTE — Lactation Note (Signed)
This note was copied from a baby's chart. Lactation Consultation Note  Patient Name: Morgan Ward ZOXWR'UToday's Date: 11/23/2016 Reason for consult: Initial assessment Baby at 7 hr of life. Mom thought she wanted to bf but she is getting "overwhelmed and stressed out". She stop bf her older child for the same reason. She stated she is going to offer formula "for now". Left lactation handouts in case she changes her mind. She has DEBP set up at bedside but she "does not feel like doing that right now". Discussed drying up of breast milk, breast changes, engorgement, and mastitis. She is aware of lactation services and will call as needed.   Maternal Data    Feeding Feeding Type: Formula Nipple Type: Slow - flow Length of feed: 1 min  LATCH Score/Interventions Latch: Repeated attempts needed to sustain latch, nipple held in mouth throughout feeding, stimulation needed to elicit sucking reflex. Intervention(s): Adjust position;Assist with latch;Breast compression  Audible Swallowing: None Intervention(s): Hand expression  Type of Nipple: Flat Intervention(s): Double electric pump (per Mom's request)  Comfort (Breast/Nipple): Soft / non-tender     Hold (Positioning): Assistance needed to correctly position infant at breast and maintain latch. Intervention(s): Breastfeeding basics reviewed;Support Pillows;Position options;Skin to skin  LATCH Score: 5  Lactation Tools Discussed/Used Pump Review: Setup, frequency, and cleaning;Milk Storage Initiated by:: Bettie RN   Consult Status Consult Status: Complete    [name] 11/23/2016, 3:56 PM

## 2016-11-23 NOTE — Anesthesia Postprocedure Evaluation (Signed)
Anesthesia Post Note  Patient: Morgan Ward  Procedure(s) Performed: Procedure(s) (LRB): CESAREAN SECTION (N/A)  Patient location during evaluation: PACU Anesthesia Type: Spinal Level of consciousness: oriented and awake and alert Pain management: pain level controlled Vital Signs Assessment: post-procedure vital signs reviewed and stable Respiratory status: spontaneous breathing, respiratory function stable and nonlabored ventilation Cardiovascular status: blood pressure returned to baseline and stable Postop Assessment: no headache and no backache Anesthetic complications: no        Last Vitals:  Vitals:   11/23/16 0916 11/23/16 0930  BP: (!) 105/57 100/76  Pulse:    Resp: 20 19  Temp:      Last Pain:  Vitals:   11/23/16 0854  TempSrc: Oral  PainSc: 0-No pain   Pain Goal:                 [name] A.

## 2016-11-23 NOTE — Op Note (Signed)
Cesarean Section Procedure Note   Morgan Ward  11/23/2016  Indications: Scheduled Proceedure/Maternal Request   Pre-operative Diagnosis: previous X 1.   Post-operative Diagnosis: Same   Surgeon: Surgeon(s) and Role:    * Zelphia CairoGretchen Lundy Cozart, MD - Primary   Assistants: Odelia GageHeather K  Anesthesia: spinal   Procedure Details:  The patient was seen in the Holding Room. The risks, benefits, complications, treatment options, and expected outcomes were discussed with the patient. The patient concurred with the proposed plan, giving informed consent. identified as Morgan Coopiffany B Kading and the procedure verified as C-Section Delivery. A Time Out was held and the above information confirmed.  After induction of anesthesia, the patient was draped and prepped in the usual sterile manner. A transverse was made and carried down through the subcutaneous tissue to the fascia. Fascial incision was made and extended transversely. The fascia was separated from the underlying rectus tissue superiorly and inferiorly. The peritoneum was identified and entered. Peritoneal incision was extended longitudinally. Dense adhesions noted to anterior uterine wall and left peritoneum.  The utero-vesical peritoneal reflection was incised transversely and the bladder flap was bluntly freed from the lower uterine segment. A low transverse uterine incision was made. Delivered from vertex presentation was a viable female infant. Cord ph was not sent the umbilical cord was clamped and cut cord blood was obtained for evaluation. The placenta was removed Intact and appeared normal. The uterine outline, tubes and ovaries appeared normal}. The uterine incision was closed with running locked sutures of 0chromic gut.   Hemostasis was observed. Lavage was carried out until clear.  Peritoneum was closed with monocryl.   The fascia was then reapproximated with running sutures of 0PDS. The subcuticular closure was performed using 2-0plain gut. The skin  was closed with 4-0Vicryl.   Instrument, sponge, and needle counts were correct prior the abdominal closure and were correct at the conclusion of the case.     Estimated Blood Loss: 600cc   Urine Output: clear  Specimens: none  Complications: no complications  Disposition: PACU - hemodynamically stable.   Maternal Condition: stable   Baby condition / location:  Couplet care / Skin to Skin  Attending Attestation: I was present and scrubbed for the entire procedure.   Signed: Surgeon(s): Zelphia CairoGretchen Caden Fatica, MD

## 2016-11-23 NOTE — Anesthesia Procedure Notes (Signed)
Spinal  Patient location during procedure: OR Start time: 11/23/2016 7:38 AM Staffing Anesthesiologist: Mal AmabileFOSTER, Richrd Kuzniar Performed: anesthesiologist  Preanesthetic Checklist Completed: patient identified, site marked, surgical consent, pre-op evaluation, timeout performed, IV checked, risks and benefits discussed and monitors and equipment checked Spinal Block Patient position: sitting Prep: site prepped and draped and DuraPrep Patient monitoring: heart rate, cardiac monitor, continuous pulse ox and blood pressure Approach: midline Location: L3-4 Injection technique: single-shot Needle Needle type: Sprotte  Needle gauge: 24 G Needle length: 9 cm Needle insertion depth: 5 cm Assessment Sensory level: T4 Additional Notes Patient tolerated procedure well. Adequate sensory level.

## 2016-11-23 NOTE — Addendum Note (Signed)
Addendum  created 11/23/16 1457 by Elgie CongoNataliya H Shya Kovatch, CRNA   Sign clinical note

## 2016-11-23 NOTE — OR Nursing (Signed)
Fetal heart tones 149 in pre-op prior to cesarean section.

## 2016-11-24 LAB — CBC
HEMATOCRIT: 25.4 % — AB (ref 36.0–46.0)
Hemoglobin: 8.6 g/dL — ABNORMAL LOW (ref 12.0–15.0)
MCH: 28.2 pg (ref 26.0–34.0)
MCHC: 33.9 g/dL (ref 30.0–36.0)
MCV: 83.3 fL (ref 78.0–100.0)
PLATELETS: 227 10*3/uL (ref 150–400)
RBC: 3.05 MIL/uL — ABNORMAL LOW (ref 3.87–5.11)
RDW: 14.2 % (ref 11.5–15.5)
WBC: 8.4 10*3/uL (ref 4.0–10.5)

## 2016-11-24 LAB — BIRTH TISSUE RECOVERY COLLECTION (PLACENTA DONATION)

## 2016-11-24 NOTE — Clinical Social Work Maternal (Signed)
  CLINICAL SOCIAL WORK MATERNAL/CHILD NOTE  Patient Details  Name: Morgan Ward MRN: [id number] Date of Birth: 03/25/1991  Date:  11/24/2016  Clinical Social Worker Initiating Note:  Ferdinand Lango Zaila Crew, MSW, LCSW-A  Date/ Time Initiated:  11/24/16/1142     Child's Name:  Morgan Ward    Legal Guardian:  Mother   Need for Interpreter:  None   Date of Referral:  11/23/16     Reason for Referral:  Other (Comment) ([name] hx of anxiety )   Referral Source:  RN   Address:  [place] [address]  Phone number:  [phone]   Household Members:  Self, Minor Children   Natural Supports (not living in the home):  Parent, Friends, Immediate Family   Professional Supports: None   Employment:     Type of Work:     Education:      Pensions consultant:  [employer]   Other Resources:  [facility]   Cultural/Religious Considerations Which May Impact Care:  None reported at this time.   Strengths:  Ability to meet basic needs , Compliance with medical plan , Home prepared for child , Pediatrician chosen  ([place] )   Risk Factors/Current Problems:  None   Cognitive State:  Alert , Able to Concentrate , Insightful , Goal Oriented    Mood/Affect:  Calm , Comfortable , Interested    CSW Assessment: CSW met with [name] at bedside to complete assessment for consult regarding [name]'s hx of anxiety. At the time of this writer's arrival, [name] had two visitors at which she gave me permission continue with assessment in front of. CSW explained role and reasoning for visit. [name] notes she does experience anxiety and has since she was little. She notes going to therapy as a small child but never as an adult or teen. [name] notes she usually just manages on her own with the support of her family and is not interested in therapy or medication unless it gets significantly worse. CSW praised [name] for being forthcoming regarding her mental health dx. 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 This Probation officer reviewed symptoms with [name] and preventative measures. [name] verbalized understanding and noted in the event she experiences it again, she will contact her OBGYN to receive medication to help treat. At this time, no other needs were addressed or requested. CSW see's no barriers to d/c at this time thus, case closed to this CSW.   CSW Plan/Description:  No Further Intervention Required/No Barriers to Discharge    Oda Cogan, MSW, [facility]  Office: [phone]

## 2016-11-24 NOTE — Progress Notes (Signed)
Subjective: Postpartum Day 1: Cesarean Delivery Patient reports tolerating PO, + flatus and no problems voiding.    Objective: Vital signs in last 24 hours: Temp:  [97.5 F (36.4 C)-99.1 F (37.3 C)] 98.7 F (37.1 C) (01/20 0605) Pulse Rate:  [62-96] 86 (01/20 0605) Resp:  [16-23] 16 (01/20 0605) BP: (86-123)/(45-88) 116/62 (01/20 0605) SpO2:  [98 %-100 %] 99 % (01/20 82950605)  Physical Exam:  General: alert, cooperative and no distress Lochia: appropriate Uterine Fundus: firm Incision: healing well DVT Evaluation: No evidence of DVT seen on physical exam.   Recent Labs  11/22/16 1120 11/24/16 0535  HGB 10.9* 8.6*  HCT 32.5* 25.4*    Assessment/Plan: Status post Cesarean section. Doing well postoperatively.  Continue current care.  Mayrani Khamis II,Gibril Mastro E 11/24/2016, 8:24 AM

## 2016-11-25 LAB — TYPE AND SCREEN
ABO/RH(D): A POS
Antibody Screen: NEGATIVE
UNIT DIVISION: 0
Unit division: 0

## 2016-11-25 MED ORDER — OXYCODONE-ACETAMINOPHEN 5-325 MG PO TABS: 1.0000 | ORAL_TABLET | Freq: Four times a day (QID) | ORAL | 0 refills | Status: DC | PRN

## 2016-11-25 MED ORDER — FERROUS SULFATE 325 (65 FE) MG PO TABS: 325.0000 mg | ORAL_TABLET | Freq: Every day | ORAL | 1 refills | Status: DC

## 2016-11-25 MED ORDER — IBUPROFEN 200 MG PO TABS: 600.0000 mg | ORAL_TABLET | Freq: Four times a day (QID) | ORAL | 0 refills | Status: DC | PRN

## 2016-11-25 NOTE — Discharge Instructions (Signed)
No vaginal entry °No operation automobiles °No heavy lifting °

## 2016-11-25 NOTE — Discharge Summary (Signed)
Obstetric Discharge Summary Reason for Admission: cesarean section Prenatal Procedures: none Intrapartum Procedures: spontaneous vaginal delivery Postpartum Procedures: none Complications-Operative and Postpartum: none Hemoglobin  Date Value Ref Range Status  11/24/2016 8.6 (L) 12.0 - 15.0 g/dL Final    Comment:    REPEATED TO VERIFY DELTA CHECK NOTED    HCT  Date Value Ref Range Status  11/24/2016 25.4 (L) 36.0 - 46.0 % Final    Physical Exam:  General: alert, cooperative and no distress Lochia: appropriate Uterine Fundus: firm Incision: healing well DVT Evaluation: No evidence of DVT seen on physical exam.  Discharge Diagnoses: Term Pregnancy-delivered  Discharge Information: Date: 11/25/2016 Activity: pelvic rest Diet: routine Medications: PNV, Ibuprofen, Percocet and ferrous sulfate Condition: stable Instructions: refer to practice specific booklet Discharge to: home   Newborn Data: Live born female  Birth Weight: 6 lb 12.8 oz (3085 g) APGAR: 8, 9  Home with mother.  Shye Doty II,Lala Been E 11/25/2016, 8:25 AM

## 2017-01-04 IMAGING — US US OB TRANSVAGINAL
1 series · 15 of 28 positions shown · non-contrast
Comparison: None.

CLINICAL DATA: Bleeding.  Irregular cycles.  Early pregnancy.

EXAM:
OBSTETRIC <14 WK US AND TRANSVAGINAL OB US
TECHNIQUE: Both transabdominal and transvaginal ultrasound examinations were
performed for complete evaluation of the gestation as well as the
maternal uterus, adnexal regions, and pelvic cul-de-sac.
Transvaginal technique was performed to assess early pregnancy.

[Series 1: us ob transvaginal · 15 of 47 slices shown]
[im 1/47]
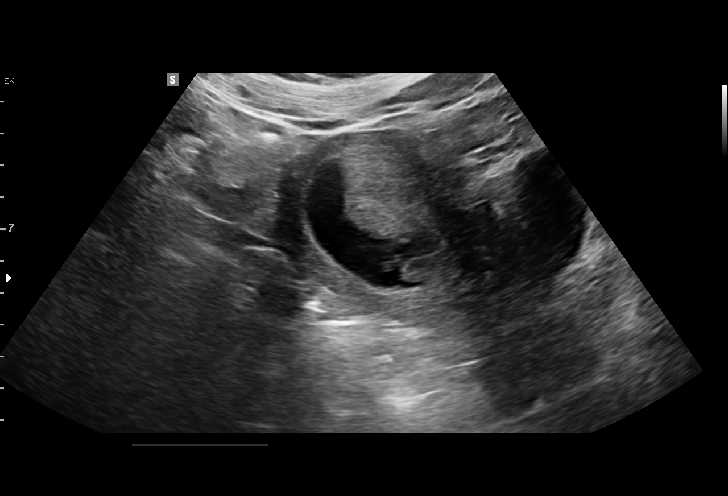
[im 4/47]
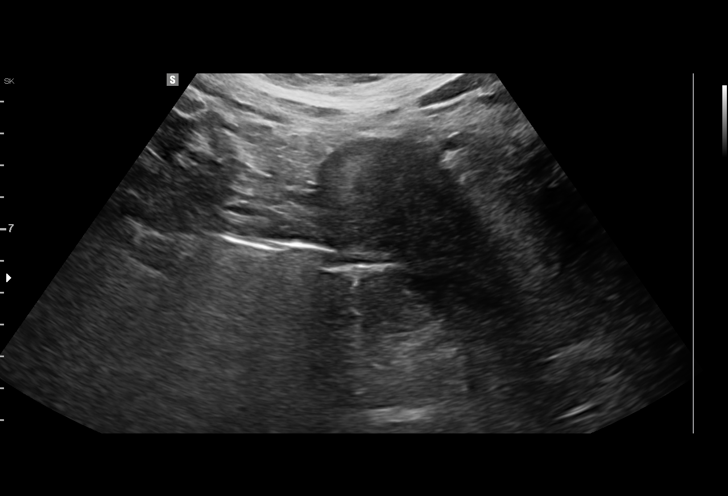
[im 7/47]
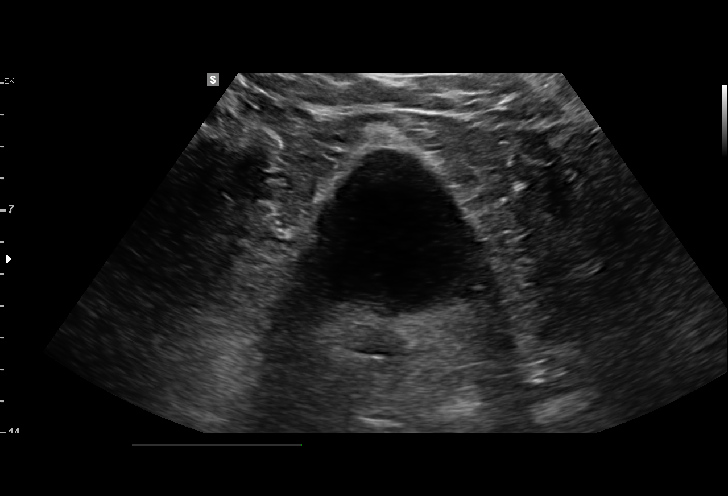
[im 11/47]
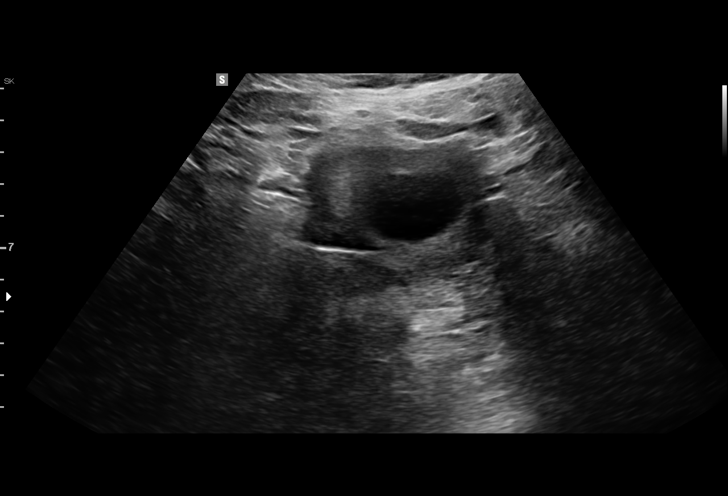
[im 14/47]
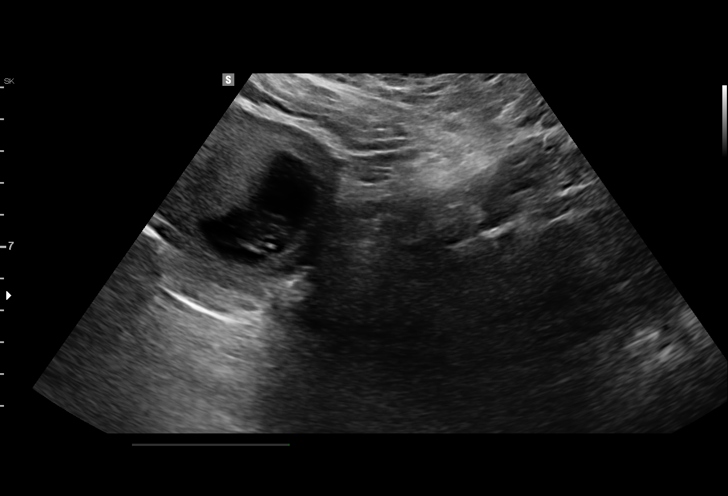
[im 18/47]
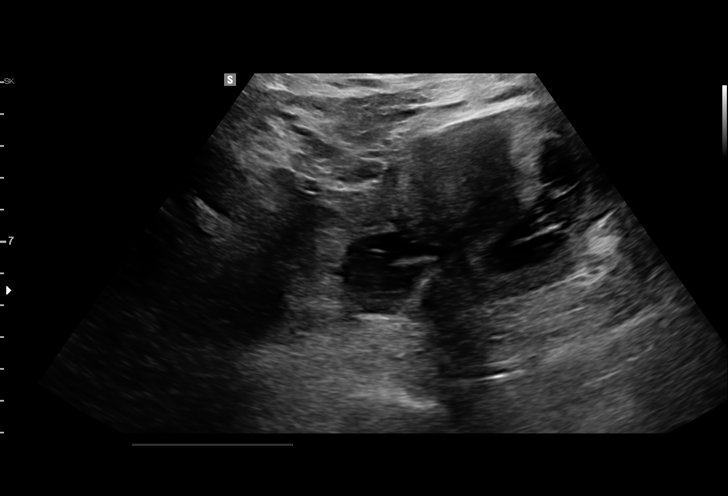
[im 21/47]
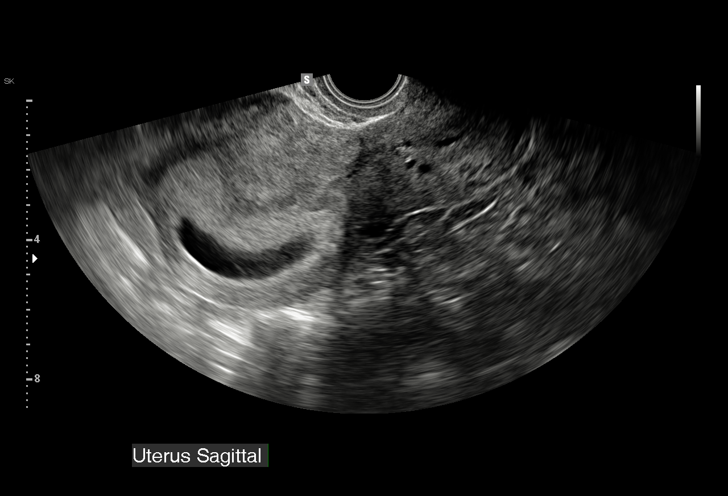
[im 24/47]
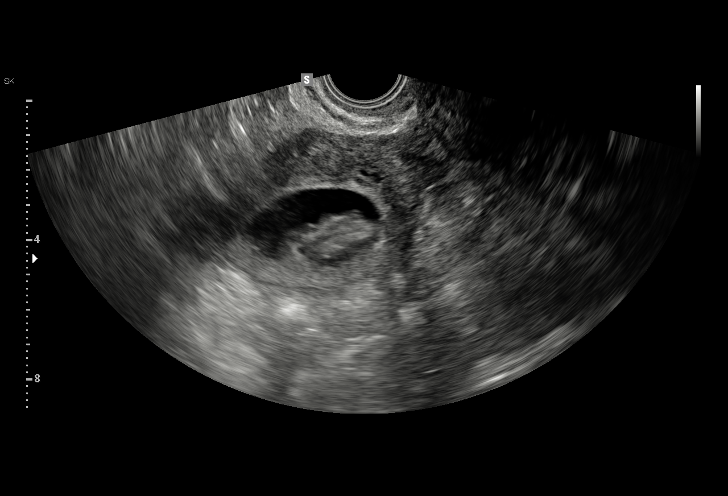
[im 26/47]
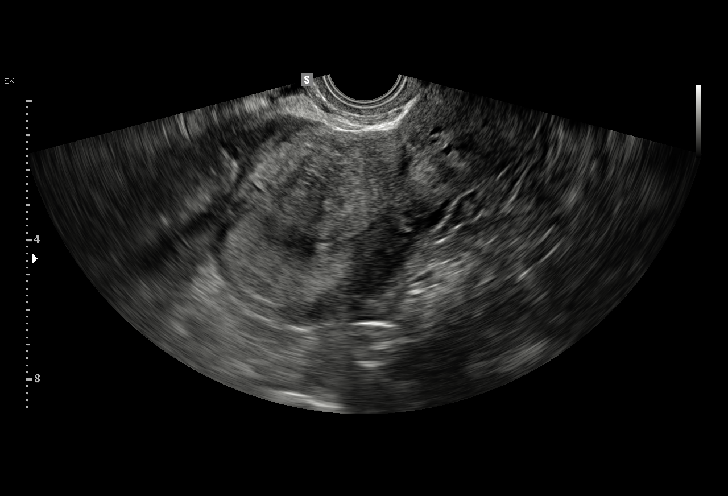
[im 29/47]
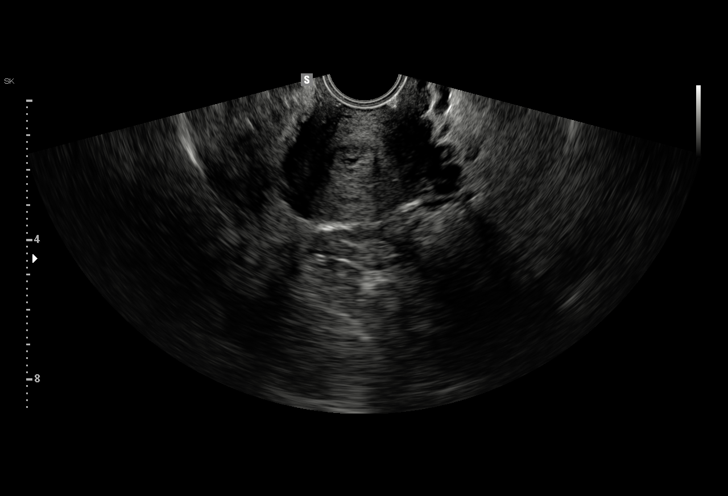
[im 33/47]
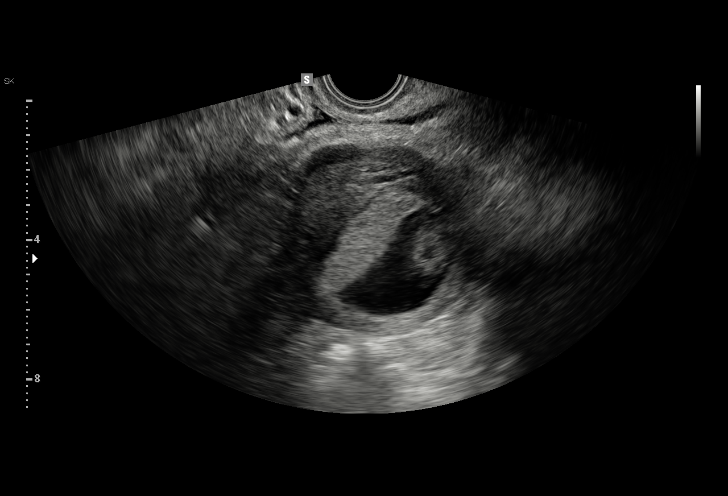
[im 36/47]
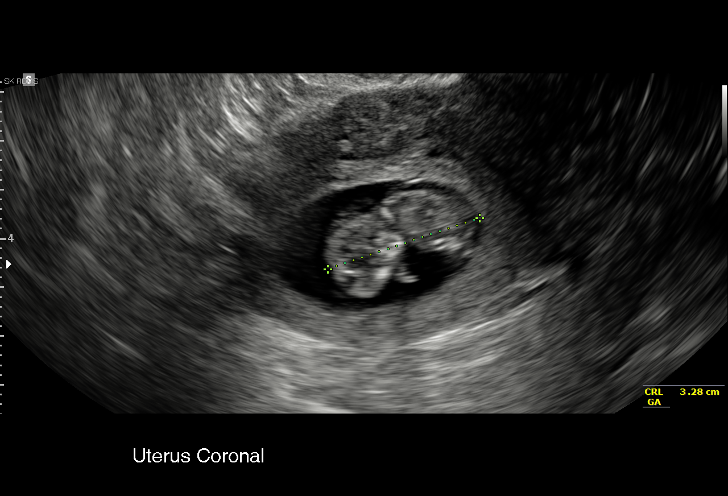
[im 40/47]
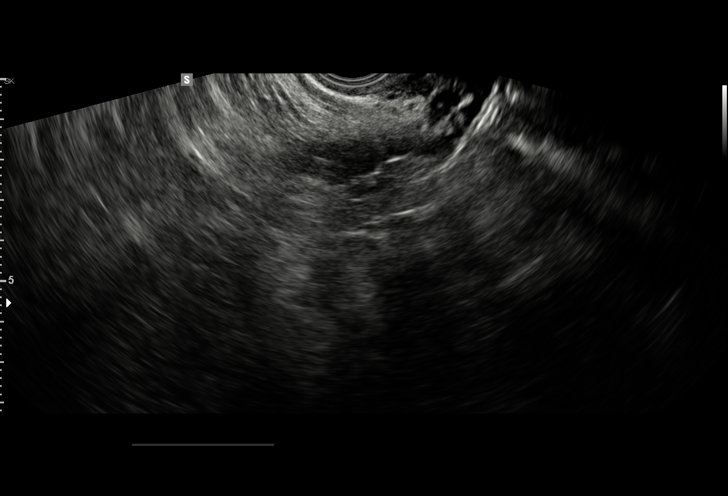
[im 43/47]
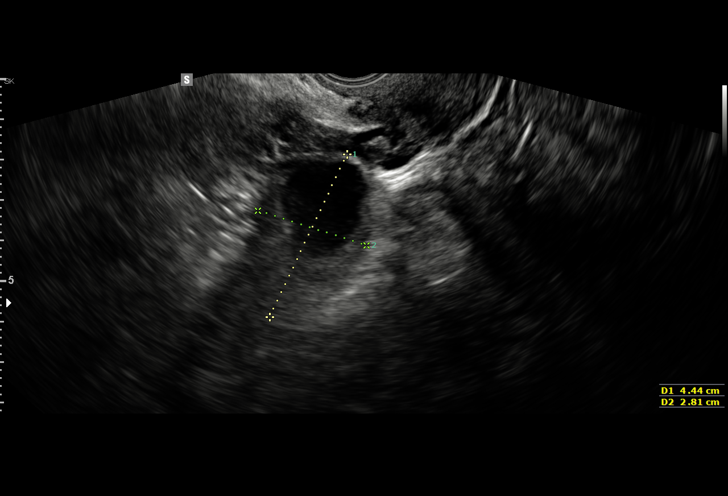
[im 47/47]
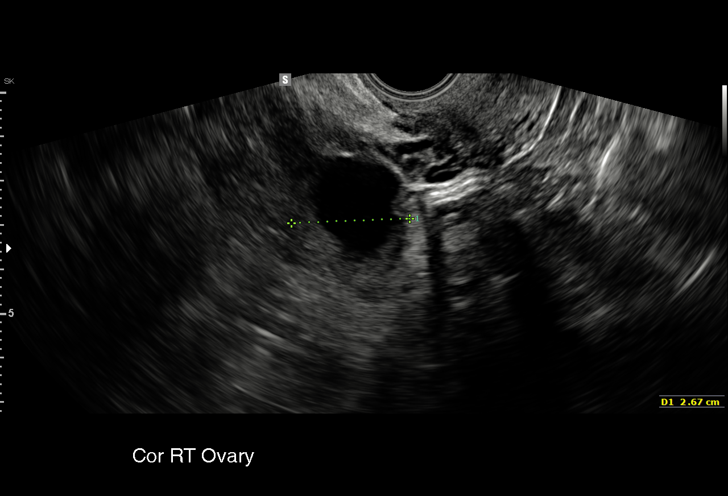

[15 of 28 positions shown; findings below may reference images not displayed]

FINDINGS: Intrauterine gestational sac: Present

Embryo:  Present

Cardiac Activity: Present

Heart Rate: 169  bpm

CRL:  33.6  mm   10 w   2 d                  US EDC: 12/04/2016

Subchorionic hemorrhage:  None visualized.

Maternal uterus/adnexae: Right ovary contains a 2.6 x 2.3 x 1.9 cm
anechoic cyst. Left ovary was never visualized. No free fluid.
IMPRESSION: Single living intrauterine pregnancy at 10 weeks 2 days by
crown-rump length. No abnormality.

## 2017-07-30 ENCOUNTER — Encounter: Payer: Self-pay | Admitting: Physician Assistant

## 2017-07-30 ENCOUNTER — Ambulatory Visit (INDEPENDENT_AMBULATORY_CARE_PROVIDER_SITE_OTHER): Payer: 59 | Admitting: Physician Assistant

## 2017-07-30 VITALS — BP 100/71 | HR 100 | Temp 97.4°F | Resp 18 | Ht 60.0 in | Wt 221.0 lb

## 2017-07-30 DIAGNOSIS — R32 Unspecified urinary incontinence: Secondary | ICD-10-CM | POA: Diagnosis not present

## 2017-07-30 DIAGNOSIS — Z7251 High risk heterosexual behavior: Secondary | ICD-10-CM | POA: Diagnosis not present

## 2017-07-30 DIAGNOSIS — Z13220 Encounter for screening for lipoid disorders: Secondary | ICD-10-CM

## 2017-07-30 DIAGNOSIS — Z0000 Encounter for general adult medical examination without abnormal findings: Secondary | ICD-10-CM

## 2017-07-30 DIAGNOSIS — Z113 Encounter for screening for infections with a predominantly sexual mode of transmission: Secondary | ICD-10-CM | POA: Diagnosis not present

## 2017-07-30 DIAGNOSIS — Z1329 Encounter for screening for other suspected endocrine disorder: Secondary | ICD-10-CM

## 2017-07-30 DIAGNOSIS — E041 Nontoxic single thyroid nodule: Secondary | ICD-10-CM

## 2017-07-30 DIAGNOSIS — Z23 Encounter for immunization: Secondary | ICD-10-CM

## 2017-07-30 DIAGNOSIS — Z130 Encounter for screening for diseases of the blood and blood-forming organs and certain disorders involving the immune mechanism: Secondary | ICD-10-CM

## 2017-07-30 DIAGNOSIS — Z6841 Body Mass Index (BMI) 40.0 and over, adult: Secondary | ICD-10-CM | POA: Diagnosis not present

## 2017-07-30 DIAGNOSIS — Z13228 Encounter for screening for other metabolic disorders: Secondary | ICD-10-CM

## 2017-07-30 LAB — POCT URINE PREGNANCY: PREG TEST UR: NEGATIVE

## 2017-07-30 MED ORDER — HYDROXYZINE HCL 25 MG PO TABS: 12.5000 mg | ORAL_TABLET | Freq: Three times a day (TID) | ORAL | 0 refills | Status: AC | PRN

## 2017-07-30 MED ORDER — BUSPIRONE HCL 15 MG PO TABS: 15.0000 mg | ORAL_TABLET | Freq: Three times a day (TID) | ORAL | 0 refills | Status: DC

## 2017-07-30 NOTE — Progress Notes (Signed)
Subjective:    Patient ID: Morgan Ward, female    DOB: [birth year], 26 y.o.   MRN: [id number]  HPI  Patient presents as a new patient for evaluation. She is 8 months status post cesarean section. She had a baby girl, with uncomplicated birth. She reports she had postpartum depression which is maintained by the Zoloft. She does report anxiety that has gotten worse post partum but is unable to identify triggers. She states she will pick at her nail cuticles until they bleed.   Patient describes urinary incontinence that has been persistent "for as long as I can remember." She has seen a urologist at [facility] for recurrent UTI's in the past and they were not able to identify etiology of the incontinence. She wears panty liners daily.   She also would like to discuss pharmaceutical weight loss options as she has been having trouble losing weight even with clean eating and exercise. She has been trying to incorporate vegetables and lean meats. She stays hydrated with water. She has a BM every other day.    Gestational diabetes? No  Pre-eclampsia or eclampsia? No  She never breast fed. She describes some milk drainage from her breast when she  Post C-section pain was controlled by pain medication, but did not use it much because it made her sick.  Contraception? Oral contraceptive, taking daily LMP? 07/15/2017 IPV? Feels safe at home. She lives at home with her 73 year old son and 66 month old daughter Development worker, international aid)  OF NOTE: Patient had a near syncopal episode with nausea while seen in the office today, likely a vasovagal episode as she was fasting today for her lab draw. She suddenly felt nauseous and lightheaded. She lied down and was given water and crackers. BP while lying down at 12:44pm was 88/54 and repeat BP taken sitting upright at 12:56pm was 98/78. Patient felt better by the end of the visit.   Review of Systems  Constitutional: Negative for activity change, appetite change, chills, fatigue  and fever.  HENT: Negative for congestion, ear pain, hearing loss, mouth sores, rhinorrhea, sinus pain, sinus pressure, sneezing, sore throat, tinnitus and trouble swallowing.   Eyes: Negative for visual disturbance.  Respiratory: Negative for cough, chest tightness and shortness of breath.   Cardiovascular: Negative for chest pain, palpitations and leg swelling.  Gastrointestinal: Negative for abdominal pain, blood in stool, constipation, diarrhea, nausea and vomiting.       Bloated  Endocrine: Negative for cold intolerance, heat intolerance and polyuria.  Genitourinary: Negative for difficulty urinating, dysuria, frequency, hematuria, vaginal discharge and vaginal pain.  Musculoskeletal: Negative for arthralgias and myalgias.  Neurological: Positive for headaches. Negative for dizziness.  Psychiatric/Behavioral: The patient is nervous/anxious.    Prior to Admission medications   Medication Sig Start Date End Date Taking? Authorizing Provider  acetaminophen (TYLENOL) 325 MG tablet Take 650 mg by mouth every 6 (six) hours as needed (for pain.).   Yes [provider]  Prenat-Fe Poly-Methfol-FA-DHA (VITAFOL ULTRA) 29-0.6-0.4-200 MG CAPS Take 1 capsule by mouth daily. TAKE WITH FOOD 11/13/16  Yes [provider]  sertraline (ZOLOFT) 100 MG tablet Take 100 mg by mouth daily.   Yes [provider]  busPIRone (BUSPAR) 15 MG tablet Take 1 tablet (15 mg total) by mouth 3 (three) times daily. 07/30/17   Porfirio Oar, PA-C  hydrOXYzine (ATARAX/VISTARIL) 25 MG tablet Take 0.5-1 tablets (12.5-25 mg total) by mouth every 8 (eight) hours as needed for anxiety. 07/30/17   Leotis Shames,  Chelle, PA-C   Allergies  Allergen Reactions  . Wellbutrin [Bupropion Hcl] Itching   Social History   Social History  . Marital status: Single    Spouse name: N/A  . Number of children: 2  . Years of education: N/A   Occupational History  . [employer]   Social History Main Topics   . Smoking status: Never Smoker  . Smokeless tobacco: Never Used  . Alcohol use No  . Drug use: No  . Sexual activity: Yes    Birth control/ protection: Pill   Other Topics Concern  . Not on file   Social History Narrative   Living with her son and daughter.   Mother lives nearby.   Patient Active Problem List   Diagnosis Date Noted  . S/P cesarean section 11/23/2016  . Allergic rhinitis, cause unspecified   . PCOS (polycystic ovarian syndrome)   . Nephrolithiasis   . Depression with anxiety   . BMI 40.0-44.9, adult (HCC)   . Recurrent herpes labialis       Objective:   Physical Exam  Constitutional: She is oriented to person, place, and time. She appears well-developed and well-nourished.  HENT:  Head: Normocephalic and atraumatic.  Right Ear: External ear normal.  Left Ear: External ear normal.  Eyes: Pupils are equal, round, and reactive to light.  Neck: No JVD present. Thyroid mass (right sided bump on thyroid; tender on palpation) present.  Cardiovascular: Normal rate, regular rhythm and normal heart sounds.  Exam reveals no gallop and no friction rub.   No murmur heard. Pulmonary/Chest: Effort normal and breath sounds normal.  Neurological: She is alert and oriented to person, place, and time. She has normal reflexes.  Skin: Skin is warm and dry. No rash noted. No erythema. No pallor.  Psychiatric: She has a normal mood and affect. Her behavior is normal. Judgment and thought content normal.      Assessment & Plan:  1. Annual physical exam  2. Flu vaccine need - Flu Vaccine QUAD 36+ mos IM  3. Routine screening for STI (sexually transmitted infection) - GC/Chlamydia Probe Amp - HIV antibody - WET PREP FOR TRICH, YEAST, CLUE - RPR  4. Unprotected sex - GC/Chlamydia Probe Amp - HIV antibody - WET PREP FOR TRICH, YEAST, CLUE - RPR - POCT urine pregnancy  5. Screening for thyroid disorder - Nodule felt on right side of midline.  - TSH  6. Screening  for hyperlipidemia - Lipid panel  7. Screening for metabolic disorder - Comprehensive metabolic panel  8. Screening for deficiency anemia - CBC with Differential/Platelet  9. BMI 40.0-44.9, adult (HCC) - Amb Ref to Medical Weight Management  10. Urinary incontinence, unspecified type - Ambulatory referral to Urology   11. Right thyroid nodule - US THYROID; Future  Respectfully, Gala Romney PA-S 2019

## 2017-07-30 NOTE — Progress Notes (Signed)
Patient ID: Morgan Ward, female    DOB: 1991-10-31, 26 y.o.   MRN: [id number]  PCP: Porfirio Oar, PA-C  Chief Complaint  Patient presents with  . Annual Exam    NO PAP, Pt states she has no immunity to chickenpox  . STD Check    Pt states she would ike to be tested for STDs due to unprotected sex.    Subjective:   Presents for Morgan Ward.  Her daughter was born via cesarean section 11/2016. She is not breast feeding. She does have some nipple discharge with "milking" the breast/nipple. Since delivery, she has used COC for contraception. She is taking sertraline with post-partum depression, which is helping, but is still having some episodes of anxiety, and requests something that she can take for those. Picks at her cuticles and fingers.  Chronic urinary incontinence. "For as long as I can remember." She has seen urology, but didn't find cause for her symptoms nor treatment. She wears a liner daily to absorb leaking urine. Interested in pharmaceutical weight loss. She has lost the weight she gained with her recent pregnancy, but remains above healthy weight. Despite healthy eating and exercise, she hasn't been able to lose any additional weight. RIGHT thyroid nodule noted on self-exam.  Gestational diabetes? No  Pre-eclampsia or eclampsia? No  She never breast fed. She describes some milk drainage from her breast when she  Post C-section pain was controlled by pain medication, but did not use it much because it made her sick.  Contraception? Oral contraceptive, taking daily LMP? 07/15/2017 IPV? Feels safe at home. She lives at home with her 3 year old son and 39 month old daughter Development worker, international aid)  Cervical Cancer Screening: with GYN Breast Cancer Screening: with GYN Colorectal Cancer Screening: not yet a candidate Bone Density Testing: not yet a candidate HIV Screening: today, due to unprotected sex. STI Screening: today, due to recent unprotected sex. Seasonal  Influenza Vaccination: today Td/Tdap Vaccination: 2017 Pneumococcal Vaccination: not yet a candidate Zoster Vaccination: not yet a candidate. Of note, her OBGYN performed labs that revealed she is NOT immune to varicella.    Patient Active Problem List   Diagnosis Date Noted  . S/P cesarean section 11/23/2016  . Allergic rhinitis, cause unspecified   . PCOS (polycystic ovarian syndrome)   . Nephrolithiasis   . Depression   . Obesity   . Recurrent herpes labialis     Past Medical History:  Diagnosis Date  . Allergic rhinitis, cause unspecified   . Anxiety   . Depression   . Depression   . History of angioedema   . Nephrolithiasis   . Obesity   . PCOS (polycystic ovarian syndrome)   . Recurrent herpes labialis      Prior to Admission medications   Medication Sig Start Date End Date Taking? Authorizing Provider  acetaminophen (TYLENOL) 325 MG tablet Take 650 mg by mouth every 6 (six) hours as needed (for pain.).   Yes [provider]  Prenat-Fe Poly-Methfol-FA-DHA (VITAFOL ULTRA) 29-0.6-0.4-200 MG CAPS Take 1 capsule by mouth daily. TAKE WITH FOOD 11/13/16  Yes [provider]  sertraline (ZOLOFT) 100 MG tablet Take 100 mg by mouth daily.   Yes [provider]    Allergies  Allergen Reactions  . Sertraline Hcl [Sertraline Hcl] Diarrhea, Nausea Only and Other (See Comments)    headache  . Wellbutrin [Bupropion Hcl] Itching    Past Surgical History:  Procedure Laterality Date  . CESAREAN SECTION    .  CESAREAN SECTION N/A 11/23/2016   Procedure: CESAREAN SECTION;  Surgeon: Zelphia Cairo, MD;  Location: [facility] BIRTHING SUITES;  Service: Obstetrics;  Laterality: N/A;  Repeat edc 11/30/16 nkda need RNFA    Family History  Problem Relation Age of Onset  . Diverticulitis Mother   . Diabetes Mother   . Hypertension Mother   . Hypertension Father   . Diabetes Maternal Aunt   . Diabetes Maternal Grandmother     Social History   Social History    . Marital status: Single    Spouse name: N/A  . Number of children: 2  . Years of education: N/A   Occupational History  . [employer]   Social History Main Topics  . Smoking status: Never Smoker  . Smokeless tobacco: Never Used  . Alcohol use No  . Drug use: No  . Sexual activity: Yes    Birth control/ protection: Pill   Other Topics Concern  . None   Social History Narrative   Living with her son and daughter.   Mother lives nearby.    Review of Systems Constitutional: Negative for activity change, appetite change, chills, fatigue and fever.  HENT: Negative for congestion, ear pain, hearing loss, mouth sores, rhinorrhea, sinus pain, sinus pressure, sneezing, sore throat, tinnitus and trouble swallowing.   Eyes: Negative for visual disturbance.  Respiratory: Negative for cough, chest tightness and shortness of breath.   Cardiovascular: Negative for chest pain, palpitations and leg swelling.  Gastrointestinal: Negative for abdominal pain, blood in stool, constipation, diarrhea, nausea and vomiting.       Bloated  Endocrine: Negative for cold intolerance, heat intolerance and polyuria.  Genitourinary: Negative for difficulty urinating, dysuria, frequency, hematuria, vaginal discharge and vaginal pain.  Musculoskeletal: Negative for arthralgias and myalgias.  Neurological: Positive for headaches. Negative for dizziness.  Psychiatric/Behavioral: The patient is nervous/anxious.       Objective:  Physical Exam  Constitutional: She is oriented to person, place, and time. Vital signs are normal. She appears well-developed and well-nourished. She is active and cooperative. No distress.  BP 100/71 (BP Location: Right Arm, Patient Position: Sitting, Cuff Size: Large)   Pulse 100   Temp (!) 97.4 F (36.3 C) (Oral)   Resp 18   Ht 5' (1.524 m)   Wt 221 lb (100.2 kg)   LMP 07/15/2017   SpO2 98%   Breastfeeding? No   BMI 43.16 kg/m    HENT:  Head: Normocephalic  and atraumatic.  Right Ear: Hearing, tympanic membrane, external ear and ear canal normal. No foreign bodies.  Left Ear: Hearing, tympanic membrane, external ear and ear canal normal. No foreign bodies.  Nose: Nose normal.  Mouth/Throat: Uvula is midline, oropharynx is clear and moist and mucous membranes are normal. No oral lesions. Normal dentition. No dental abscesses or uvula swelling. No oropharyngeal exudate.  Eyes: Pupils are equal, round, and reactive to light. Conjunctivae, EOM and lids are normal. Right eye exhibits no discharge. Left eye exhibits no discharge. No scleral icterus.  Fundoscopic exam:      The right eye shows no arteriolar narrowing, no AV nicking, no exudate, no hemorrhage and no papilledema. The right eye shows red reflex.       The left eye shows no arteriolar narrowing, no AV nicking, no exudate, no hemorrhage and no papilledema. The left eye shows red reflex.  Neck: Trachea normal, normal range of motion and full passive range of motion without pain. Neck supple. No spinous process  tenderness and no muscular tenderness present. Thyroid mass (small RIGHT sided nodule, mildly tender) present. No thyromegaly present.  Cardiovascular: Normal rate, regular rhythm, normal heart sounds, intact distal pulses and normal pulses.   Pulmonary/Chest: Effort normal and breath sounds normal.  Musculoskeletal: She exhibits no edema or tenderness.       Cervical back: Normal.       Thoracic back: Normal.       Lumbar back: Normal.  Lymphadenopathy:       Head (right side): No tonsillar, no preauricular, no posterior auricular and no occipital adenopathy present.       Head (left side): No tonsillar, no preauricular, no posterior auricular and no occipital adenopathy present.    She has no cervical adenopathy.       Right: No supraclavicular adenopathy present.       Left: No supraclavicular adenopathy present.  Neurological: She is alert and oriented to person, place, and time. She  has normal strength and normal reflexes. No cranial nerve deficit. She exhibits normal muscle tone. Coordination and gait normal.  Skin: Skin is warm, dry and intact. No rash noted. She is not diaphoretic. No cyanosis or erythema. Nails show no clubbing.  Psychiatric: She has a normal mood and affect. Her speech is normal and behavior is normal. Judgment and thought content normal.    Wt Readings from Last 3 Encounters:  07/30/17 221 lb (100.2 kg)  11/23/16 236 lb (107 kg)  11/13/16 236 lb (107 kg)   OF NOTE: Patient had a near syncopal episode with nausea while seen in the office today, likely a vasovagal episode as she was fasting today for her lab draw. She suddenly felt nauseous and lightheaded. She lied down and was given water and crackers. BP while lying down at 12:44pm was 88/54 and repeat BP taken sitting upright at 12:56pm was 98/78. Patient felt better by the end of the visit.     Assessment & Plan:   Problem List Items Addressed This Visit    BMI 40.0-44.9, adult (HCC)   Relevant Orders   Amb Ref to Medical Weight Management    Other Visit Diagnoses    Annual physical exam    -  Primary   Age appropriate health guidance provided.   Flu vaccine need       Relevant Orders   Flu Vaccine QUAD 36+ mos IM (Completed)   Routine screening for STI (sexually transmitted infection)       Relevant Orders   GC/Chlamydia Probe Amp (Completed)   HIV antibody (Completed)   RPR (Completed)   Unprotected sex       Relevant Orders   GC/Chlamydia Probe Amp (Completed)   HIV antibody (Completed)   RPR (Completed)   POCT urine pregnancy (Completed)   Screening for thyroid disorder       Relevant Orders   TSH (Completed)   Screening for hyperlipidemia       Relevant Orders   Lipid panel (Completed)   Screening for metabolic disorder       Relevant Orders   Comprehensive metabolic panel (Completed)   Screening for deficiency anemia       Relevant Orders   CBC with  Differential/Platelet (Completed)   Urinary incontinence, unspecified type       Relevant Orders   Ambulatory referral to Urology   Right thyroid nodule       Relevant Orders   US THYROID       Return in about 6 weeks (  around 09/10/2017) for re-evaluation of anxiety.   Fernande Bras, PA-C [facility] at [facility]

## 2017-07-30 NOTE — Patient Instructions (Addendum)
You'll need to obtain the vaccine for chicken pox (Varicella) at the health department or your local pharmacy.  Buspirone: The tablets are 15 mg each and are scored to be broken in half (7.5 mg) or in thirds (5 mg). Start by taking 5 mg (1/3 tablet) twice each day x 1 week. Then 7.5 mg (1/2 tablet) twice each day x 1 week. Then 10 mg (2/3 tablet) twice each day x 1 week. Then 12.5 mg (1/2 tablet + 1/3 tablet) twice each day x 1 week. Then 15 mg twice (1 whole tablet) each day x 1 week.      IF you received an x-ray today, you will receive an invoice from [facility]. Please contact [facility] at [phone] with questions or concerns regarding your invoice.   IF you received labwork today, you will receive an invoice from [facility]. Please contact [facility] at [phone] with questions or concerns regarding your invoice.   Our billing staff will not be able to assist you with questions regarding bills from these companies.  You will be contacted with the lab results as soon as they are available. The fastest way to get your results is to activate your My Chart account. Instructions are located on the last page of this paperwork. If you have not heard from [place] regarding the results in 2 weeks, please contact this office.      Preventive Care 18-39 Years, Female Preventive care refers to lifestyle choices and visits with your health care provider that can promote health and wellness. What does preventive care include?  A yearly physical exam. This is also called an annual well check.  Dental exams once or twice a year.  Routine eye exams. Ask your health care provider how often you should have your eyes checked.  Personal lifestyle choices, including: ? Daily care of your teeth and gums. ? Regular physical activity. ? Eating a healthy diet. ? Avoiding tobacco and drug use. ? Limiting alcohol use. ? Practicing safe sex. ? Taking vitamin and mineral  supplements as recommended by your health care provider. What happens during an annual well check? The services and screenings done by your health care provider during your annual well check will depend on your age, overall health, lifestyle risk factors, and family history of disease. Counseling Your health care provider may ask you questions about your:  Alcohol use.  Tobacco use.  Drug use.  Emotional well-being.  Home and relationship well-being.  Sexual activity.  Eating habits.  Work and work Statistician.  Method of birth control.  Menstrual cycle.  Pregnancy history.  Screening You may have the following tests or measurements:  Height, weight, and BMI.  Diabetes screening. This is done by checking your blood sugar (glucose) after you have not eaten for a while (fasting).  Blood pressure.  Lipid and cholesterol levels. These may be checked every 5 years starting at age 63.  Skin check.  Hepatitis C blood test.  Hepatitis B blood test.  Sexually transmitted disease (STD) testing.  BRCA-related cancer screening. This may be done if you have a family history of breast, ovarian, tubal, or peritoneal cancers.  Pelvic exam and Pap test. This may be done every 3 years starting at age 39. Starting at age 59, this may be done every 5 years if you have a Pap test in combination with an HPV test.  Discuss your test results, treatment options, and if necessary, the need for more tests with your health care provider. Vaccines  Your health care provider may recommend certain vaccines, such as:  Influenza vaccine. This is recommended every year.  Tetanus, diphtheria, and acellular pertussis (Tdap, Td) vaccine. You may need a Td booster every 10 years.  Varicella vaccine. You may need this if you have not been vaccinated.  HPV vaccine. If you are 64 or younger, you may need three doses over 6 months.  Measles, mumps, and rubella (MMR) vaccine. You may need at least  one dose of MMR. You may also need a second dose.  Pneumococcal 13-valent conjugate (PCV13) vaccine. You may need this if you have certain conditions and were not previously vaccinated.  Pneumococcal polysaccharide (PPSV23) vaccine. You may need one or two doses if you smoke cigarettes or if you have certain conditions.  Meningococcal vaccine. One dose is recommended if you are age 63-21 years and a first-year college student living in a residence hall, or if you have one of several medical conditions. You may also need additional booster doses.  Hepatitis A vaccine. You may need this if you have certain conditions or if you travel or work in places where you may be exposed to hepatitis A.  Hepatitis B vaccine. You may need this if you have certain conditions or if you travel or work in places where you may be exposed to hepatitis B.  Haemophilus influenzae type b (Hib) vaccine. You may need this if you have certain risk factors.  Talk to your health care provider about which screenings and vaccines you need and how often you need them. This information is not intended to replace advice given to you by your health care provider. Make sure you discuss any questions you have with your health care provider. Document Released: 12/18/2001 Document Revised: 07/11/2016 Document Reviewed: 08/23/2015 Elsevier Interactive Patient Education  2017 [employer].

## 2017-07-31 LAB — CBC WITH DIFFERENTIAL/PLATELET
BASOS ABS: 0 10*3/uL (ref 0.0–0.2)
Basos: 0 %
EOS (ABSOLUTE): 0.1 10*3/uL (ref 0.0–0.4)
Eos: 1 %
Hematocrit: 39.8 % (ref 34.0–46.6)
Hemoglobin: 12.7 g/dL (ref 11.1–15.9)
Immature Grans (Abs): 0 10*3/uL (ref 0.0–0.1)
Immature Granulocytes: 0 %
LYMPHS ABS: 1.3 10*3/uL (ref 0.7–3.1)
Lymphs: 15 %
MCH: 27.3 pg (ref 26.6–33.0)
MCHC: 31.9 g/dL (ref 31.5–35.7)
MCV: 85 fL (ref 79–97)
MONOS ABS: 0.3 10*3/uL (ref 0.1–0.9)
Monocytes: 4 %
Neutrophils Absolute: 7.1 10*3/uL — ABNORMAL HIGH (ref 1.4–7.0)
Neutrophils: 80 %
Platelets: 353 10*3/uL (ref 150–379)
RBC: 4.66 x10E6/uL (ref 3.77–5.28)
RDW: 14.5 % (ref 12.3–15.4)
WBC: 8.8 10*3/uL (ref 3.4–10.8)

## 2017-07-31 LAB — COMPREHENSIVE METABOLIC PANEL
ALBUMIN: 4.6 g/dL (ref 3.5–5.5)
ALK PHOS: 60 IU/L (ref 39–117)
ALT: 9 IU/L (ref 0–32)
AST: 14 IU/L (ref 0–40)
Albumin/Globulin Ratio: 1.6 (ref 1.2–2.2)
BILIRUBIN TOTAL: 0.3 mg/dL (ref 0.0–1.2)
BUN / CREAT RATIO: 16 (ref 9–23)
BUN: 10 mg/dL (ref 6–20)
CHLORIDE: 100 mmol/L (ref 96–106)
CO2: 22 mmol/L (ref 20–29)
Calcium: 9.4 mg/dL (ref 8.7–10.2)
Creatinine, Ser: 0.61 mg/dL (ref 0.57–1.00)
GFR calc Af Amer: 144 mL/min/{1.73_m2} (ref >59)
GFR calc non Af Amer: 125 mL/min/{1.73_m2} (ref >59)
GLUCOSE: 84 mg/dL (ref 65–99)
Globulin, Total: 2.8 g/dL (ref 1.5–4.5)
Potassium: 4.7 mmol/L (ref 3.5–5.2)
SODIUM: 139 mmol/L (ref 134–144)
Total Protein: 7.4 g/dL (ref 6.0–8.5)

## 2017-07-31 LAB — LIPID PANEL
Chol/HDL Ratio: 3.4 ratio (ref 0.0–4.4)
Cholesterol, Total: 162 mg/dL (ref 100–199)
HDL: 48 mg/dL (ref >39)
LDL Calculated: 93 mg/dL (ref 0–99)
Triglycerides: 107 mg/dL (ref 0–149)
VLDL Cholesterol Cal: 21 mg/dL (ref 5–40)

## 2017-07-31 LAB — GC/CHLAMYDIA PROBE AMP
CHLAMYDIA, DNA PROBE: NEGATIVE
Neisseria gonorrhoeae by PCR: NEGATIVE

## 2017-07-31 LAB — TSH: TSH: 7.77 u[IU]/mL — AB (ref 0.450–4.500)

## 2017-07-31 LAB — RPR: RPR Ser Ql: NONREACTIVE

## 2017-07-31 LAB — HIV ANTIBODY (ROUTINE TESTING W REFLEX): HIV Screen 4th Generation wRfx: NONREACTIVE

## 2017-08-06 ENCOUNTER — Ambulatory Visit (INDEPENDENT_AMBULATORY_CARE_PROVIDER_SITE_OTHER): Payer: 59 | Admitting: Family Medicine

## 2017-08-06 ENCOUNTER — Encounter: Payer: Self-pay | Admitting: Family Medicine

## 2017-08-06 VITALS — BP 118/82 | HR 63 | Temp 98.2°F | Resp 16 | Ht 60.0 in | Wt 226.0 lb

## 2017-08-06 DIAGNOSIS — R300 Dysuria: Secondary | ICD-10-CM

## 2017-08-06 DIAGNOSIS — N3000 Acute cystitis without hematuria: Secondary | ICD-10-CM | POA: Diagnosis not present

## 2017-08-06 LAB — POCT URINALYSIS DIPSTICK
Bilirubin, UA: NEGATIVE
Blood, UA: NEGATIVE
Glucose, UA: NEGATIVE
Ketones, UA: NEGATIVE
Nitrite, UA: NEGATIVE
Protein, UA: NEGATIVE
Spec Grav, UA: 1.02 (ref 1.010–1.025)
Urobilinogen, UA: 0.2 E.U./dL
pH, UA: 6 (ref 5.0–8.0)

## 2017-08-06 LAB — POC MICROSCOPIC URINALYSIS (UMFC): Mucus: ABSENT

## 2017-08-06 MED ORDER — NITROFURANTOIN MONOHYD MACRO 100 MG PO CAPS: 100.0000 mg | ORAL_CAPSULE | Freq: Two times a day (BID) | ORAL | 0 refills | Status: DC

## 2017-08-06 NOTE — Patient Instructions (Addendum)
Please call this patient. The lab results are essentially normal. The thyroid test is a little elevated, but isn't likely contributing to her anxiety. I recommend rechecking it in 3 months, to make sure that it normalizes as expected.    IF you received an x-ray today, you will receive an invoice from [facility]. Please contact [facility] at [phone] with questions or concerns regarding your invoice.   IF you received labwork today, you will receive an invoice from [facility]. Please contact [facility] at [phone] with questions or concerns regarding your invoice.   Our billing staff will not be able to assist you with questions regarding bills from these companies.  You will be contacted with the lab results as soon as they are available. The fastest way to get your results is to activate your My Chart account. Instructions are located on the last page of this paperwork. If you have not heard from [place] regarding the results in 2 weeks, please contact this office.

## 2017-08-06 NOTE — Progress Notes (Signed)
10/2/201810:39 AM  Morgan Ward 1991-04-13, 26 y.o. female [id number]  Chief Complaint  Patient presents with  . Dysuria    per patient, sx began a few days ago, c/o pain and urinary frequency    HPI:   Patient is a 26 y.o. female who presents today for several days of dysuria with urinary urgency and frequency, mild suprapubic pressure. No hematuria, fever, chills, nausea, vomiting, flank pain. Last uti during pregnancy, about a year ago. Not breastfeeding.   Depression screen [facility] 2/9 08/06/2017 07/30/2017 06/27/2015  Decreased Interest 0 0 0  Down, Depressed, Hopeless 0 0 0  PHQ - 2 Score 0 0 0    Allergies  Allergen Reactions  . Wellbutrin [Bupropion Hcl] Itching    Current Outpatient Prescriptions on File Prior to Visit  Medication Sig Dispense Refill  . acetaminophen (TYLENOL) 325 MG tablet Take 650 mg by mouth every 6 (six) hours as needed (for pain.).    Marland Kitchen busPIRone (BUSPAR) 15 MG tablet Take 1 tablet (15 mg total) by mouth 3 (three) times daily. 60 tablet 0  . hydrOXYzine (ATARAX/VISTARIL) 25 MG tablet Take 0.5-1 tablets (12.5-25 mg total) by mouth every 8 (eight) hours as needed for anxiety. 30 tablet 0  . Prenat-Fe Poly-Methfol-FA-DHA (VITAFOL ULTRA) 29-0.6-0.4-200 MG CAPS Take 1 capsule by mouth daily. TAKE WITH FOOD  12  . sertraline (ZOLOFT) 100 MG tablet Take 100 mg by mouth daily.     No current facility-administered medications on file prior to visit.     Past Medical History:  Diagnosis Date  . Allergic rhinitis, cause unspecified   . Anxiety   . Depression   . Depression   . History of angioedema   . Nephrolithiasis   . Obesity   . PCOS (polycystic ovarian syndrome)   . Recurrent herpes labialis     Past Surgical History:  Procedure Laterality Date  . CESAREAN SECTION    . CESAREAN SECTION N/A 11/23/2016   Procedure: CESAREAN SECTION;  Surgeon: Zelphia Cairo, MD;  Location: [facility] BIRTHING SUITES;  Service: Obstetrics;  Laterality: N/A;   Repeat edc 11/30/16 nkda need RNFA    Social History  Substance Use Topics  . Smoking status: Never Smoker  . Smokeless tobacco: Never Used  . Alcohol use No    Family History  Problem Relation Age of Onset  . Diverticulitis Mother   . Diabetes Mother   . Hypertension Mother   . Hypertension Father   . Diabetes Maternal Aunt   . Diabetes Maternal Grandmother     ROS Per hpi  OBJECTIVE:  Blood pressure 118/82, pulse 63, temperature 98.2 F (36.8 C), temperature source Oral, resp. rate 16, height 5' (1.524 m), weight 226 lb (102.5 kg), last menstrual period 07/15/2017, SpO2 98 %, not currently breastfeeding.  Physical Exam  Gen: AAOx3, NAD HEENT: [place]/AT, PERRL, EOMI, MMM Neck is supple Resp: CTAB, no w/r/r CV: RRR, normal S1/S2, no m/r/g Back; no CVAT Abd: + BS, soft, NT/ND Skin; warm and dry  Results for orders placed or performed in visit on 08/06/17 (from the past 24 hour(s))  POCT urinalysis dipstick     Status: Abnormal   Collection Time: 08/06/17  9:06 AM  Result Value Ref Range   Color, UA yellow    Clarity, UA cloudy    Glucose, UA negative    Bilirubin, UA negative    Ketones, UA negative    Spec Grav, UA 1.020 1.010 - 1.025   Blood, UA negative  pH, UA 6.0 5.0 - 8.0   Protein, UA negative    Urobilinogen, UA 0.2 0.2 or 1.0 E.U./dL   Nitrite, UA negative    Leukocytes, UA Moderate (2+) (A) Negative  POCT Microscopic Urinalysis ([facility])     Status: Abnormal   Collection Time: 08/06/17  9:24 AM  Result Value Ref Range   WBC,UR,HPF,POC Many (A) None WBC/hpf   RBC,UR,HPF,POC None None RBC/hpf   Bacteria Few (A) None, Too numerous to count   Mucus Absent Absent   Epithelial Cells, UR Per Microscopy None None, Too numerous to count cells/hpf      ASSESSMENT and PLAN  1. Acute cystitis without hematuria Discussed supportive measures, new abx r/se/b. Advised use of probiotics. RTC precautions given.   - nitrofurantoin, macrocrystal-monohydrate,  (MACROBID) 100 MG capsule; Take 1 capsule (100 mg total) by mouth 2 (two) times daily.  Dispense: 14 capsule; Refill: 0  2. Dysuria See above - POCT urinalysis dipstick - POCT Microscopic Urinalysis ([facility])  Return if symptoms worsen or fail to improve.    Myles Lipps, MD Primary Care at [facility] [address] [place], [place] [address] Ph.  [phone] Fax [phone]

## 2017-08-07 ENCOUNTER — Ambulatory Visit
Admission: RE | Admit: 2017-08-07 | Discharge: 2017-08-07 | Disposition: A | Discharge status: Home / Self Care | Payer: 59 | Source: Ambulatory Visit | Attending: Physician Assistant | Admitting: Physician Assistant

## 2017-08-07 DIAGNOSIS — E041 Nontoxic single thyroid nodule: Secondary | ICD-10-CM

## 2017-10-11 ENCOUNTER — Other Ambulatory Visit: Payer: Self-pay | Admitting: Physician Assistant

## 2017-11-25 ENCOUNTER — Other Ambulatory Visit: Payer: Self-pay

## 2017-11-25 ENCOUNTER — Ambulatory Visit: Payer: 59 | Admitting: Family Medicine

## 2017-11-25 ENCOUNTER — Encounter: Payer: Self-pay | Admitting: Family Medicine

## 2017-11-25 VITALS — BP 118/74 | HR 78 | Temp 98.3°F | Ht 61.0 in | Wt 227.2 lb

## 2017-11-25 DIAGNOSIS — R300 Dysuria: Secondary | ICD-10-CM | POA: Diagnosis not present

## 2017-11-25 DIAGNOSIS — N3000 Acute cystitis without hematuria: Secondary | ICD-10-CM

## 2017-11-25 LAB — POCT URINALYSIS DIP (MANUAL ENTRY)
Blood, UA: NEGATIVE
Glucose, UA: 250 mg/dL — AB
Nitrite, UA: POSITIVE — AB
Protein Ur, POC: 100 mg/dL — AB
Spec Grav, UA: 1.015 (ref 1.010–1.025)
Urobilinogen, UA: >=8 E.U./dL — AB
pH, UA: 5 (ref 5.0–8.0)

## 2017-11-25 LAB — POC MICROSCOPIC URINALYSIS (UMFC): Mucus: ABSENT

## 2017-11-25 MED ORDER — NITROFURANTOIN MONOHYD MACRO 100 MG PO CAPS: 100.0000 mg | ORAL_CAPSULE | Freq: Two times a day (BID) | ORAL | 0 refills | Status: AC

## 2017-11-25 NOTE — Progress Notes (Signed)
1/21/20191:56 PM  Morgan Ward [birth year], 27 y.o. female [id number]  Chief Complaint  Patient presents with  . Urinary Tract Infection    since Saturday having burning and some itching. Taking AZO with some resolve.    HPI:   Patient is a 27 y.o. female who presents today for 3 days of pain with urination, frequency with low output. Denies blood in her urine. Vomited last night but thinks that is from taking azo on an empty stomach. She denies any flank pain, fever or chills. Today not feeling nauseous, no vomiting. She has a h/o recurrent UTI, evaluated by urology at one point wo any significant findings. Last UTI 08/2018. She denies any any hospitalizations or pyelo. She does have h/o kidney stones. She does not smoke.    Depression screen [facility] 2/9 11/25/2017 08/06/2017 07/30/2017  Decreased Interest 0 0 0  Down, Depressed, Hopeless 0 0 0  PHQ - 2 Score 0 0 0    Allergies  Allergen Reactions  . Wellbutrin [Bupropion Hcl] Itching    Prior to Admission medications   Medication Sig Start Date End Date Taking? Authorizing Provider  acetaminophen (TYLENOL) 325 MG tablet Take 650 mg by mouth every 6 (six) hours as needed (for pain.).   Yes [provider]  busPIRone (BUSPAR) 15 MG tablet Take 1 tablet (15 mg total) by mouth 3 (three) times daily. Needs ov for additional refills 10/13/17  Yes Jeffery, Chelle, PA-C  hydrOXYzine (ATARAX/VISTARIL) 25 MG tablet Take 0.5-1 tablets (12.5-25 mg total) by mouth every 8 (eight) hours as needed for anxiety. 07/30/17  Yes Jeffery, Chelle, PA-C  sertraline (ZOLOFT) 100 MG tablet Take 100 mg by mouth daily.   Yes [provider]    Past Medical History:  Diagnosis Date  . Allergic rhinitis, cause unspecified   . Anxiety   . Depression   . Depression   . History of angioedema   . Nephrolithiasis   . Obesity   . PCOS (polycystic ovarian syndrome)   . Recurrent herpes labialis     Past Surgical History:  Procedure  Laterality Date  . CESAREAN SECTION    . CESAREAN SECTION N/A 11/23/2016   Procedure: CESAREAN SECTION;  Surgeon: Zelphia Cairo, MD;  Location: [facility] BIRTHING SUITES;  Service: Obstetrics;  Laterality: N/A;  Repeat edc 11/30/16 nkda need RNFA    Social History   Tobacco Use  . Smoking status: Never Smoker  . Smokeless tobacco: Never Used  Substance Use Topics  . Alcohol use: No    Family History  Problem Relation Age of Onset  . Diverticulitis Mother   . Diabetes Mother   . Hypertension Mother   . Hypertension Father   . Diabetes Maternal Aunt   . Diabetes Maternal Grandmother     ROS Per hpi  OBJECTIVE:  Blood pressure 118/74, pulse 78, temperature 98.3 F (36.8 C), temperature source Oral, height 5\' 1"  (1.549 m), weight 227 lb 3.2 oz (103.1 kg), SpO2 97 %, not currently breastfeeding.  Physical Exam  Constitutional: She is oriented to person, place, and time and well-developed, well-nourished, and in no distress.  HENT:  Head: Normocephalic and atraumatic.  Mouth/Throat: Oropharynx is clear and moist. No oropharyngeal exudate.  Eyes: EOM are normal. Pupils are equal, round, and reactive to light. No scleral icterus.  Neck: Neck supple.  Cardiovascular: Normal rate, regular rhythm and normal heart sounds. Exam reveals no gallop and no friction rub.  No murmur heard. Pulmonary/Chest: Effort normal and breath sounds  normal. She has no wheezes. She has no rales.  Abdominal: There is no CVA tenderness.  Musculoskeletal: She exhibits no edema.  Neurological: She is alert and oriented to person, place, and time. Gait normal.  Skin: Skin is warm and dry.    Results for orders placed or performed in visit on 11/25/17 (from the past 24 hour(s))  POCT urinalysis dipstick     Status: Abnormal   Collection Time: 11/25/17  1:44 PM  Result Value Ref Range   Color, UA red (A) yellow   Clarity, UA clear clear   Glucose, UA =250 (A) negative mg/dL   Bilirubin, UA moderate (A)  negative   Ketones, POC UA small (15) (A) negative mg/dL   Spec Grav, UA 4.5401.015 9.8111.010 - 1.025   Blood, UA negative negative   pH, UA 5.0 5.0 - 8.0   Protein Ur, POC =100 (A) negative mg/dL   Urobilinogen, UA >=9.1>=8.0 (A) 0.2 or 1.0 E.U./dL   Nitrite, UA Positive (A) Negative   Leukocytes, UA Large (3+) (A) Negative  POCT Microscopic Urinalysis ([facility])     Status: Abnormal   Collection Time: 11/25/17  2:13 PM  Result Value Ref Range   WBC,UR,HPF,POC Many (A) None WBC/hpf   RBC,UR,HPF,POC None None RBC/hpf   Bacteria Few (A) None, Too numerous to count   Mucus Absent Absent   Epithelial Cells, UR Per Microscopy Many (A) None, Too numerous to count cells/hpf      ASSESSMENT and PLAN  1. Acute cystitis without hematuria Discussed supportive measures, new meds r/se/b and RTC precautions. Patient educational handout given. - nitrofurantoin, macrocrystal-monohydrate, (MACROBID) 100 MG capsule; Take 1 capsule (100 mg total) by mouth 2 (two) times daily.  2. Dysuria - POCT urinalysis dipstick - POCT Microscopic Urinalysis ([facility]) - Urine Culture  Return if symptoms worsen or fail to improve.    Myles LippsIrma M Santiago, MD Primary Care at [facility] [address] [place], [place] [address] Ph.  [phone] Fax [phone]

## 2017-11-25 NOTE — Patient Instructions (Signed)

## 2017-11-26 LAB — URINE CULTURE: Organism ID, Bacteria: NO GROWTH

## 2018-02-06 ENCOUNTER — Encounter: Payer: Self-pay | Admitting: Physician Assistant

## 2018-06-14 IMAGING — US US THYROID
1 series · 14 of 25 positions shown · non-contrast
Comparison: None.

CLINICAL DATA: Palpable abnormality. Right thyroid nodule felt on
physical exam.

EXAM:
THYROID ULTRASOUND
TECHNIQUE: Ultrasound examination of the thyroid gland and adjacent soft
tissues was performed.

[Series 1: us thyroid · 0.06mm/px · 14 of 37 slices shown]
[im 1/37]
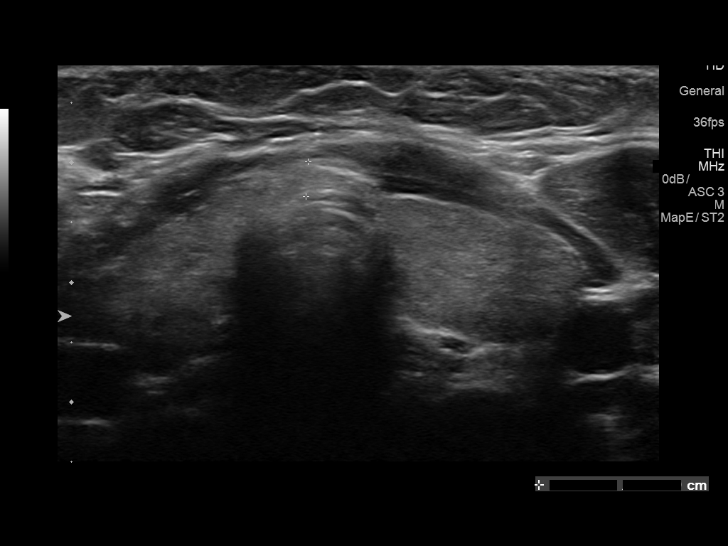
[im 4/37]
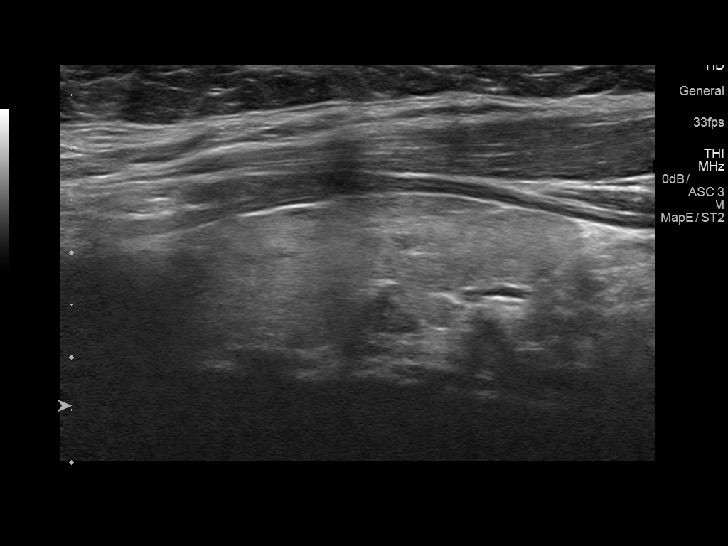
[im 7/37]
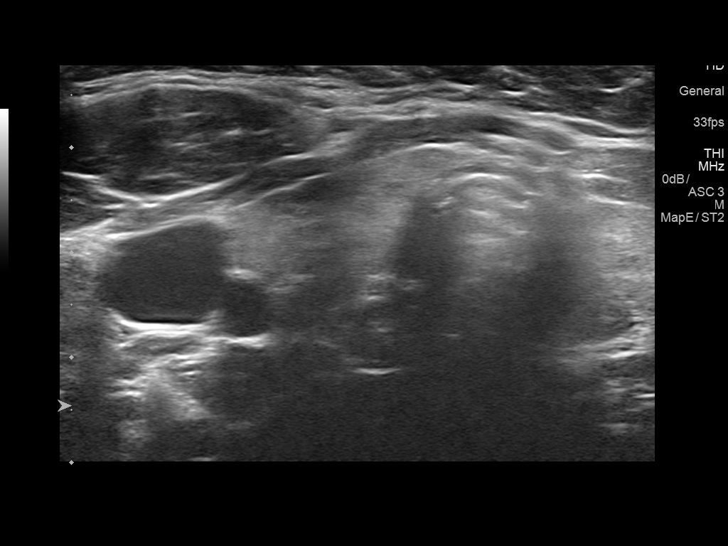
[im 10/37]
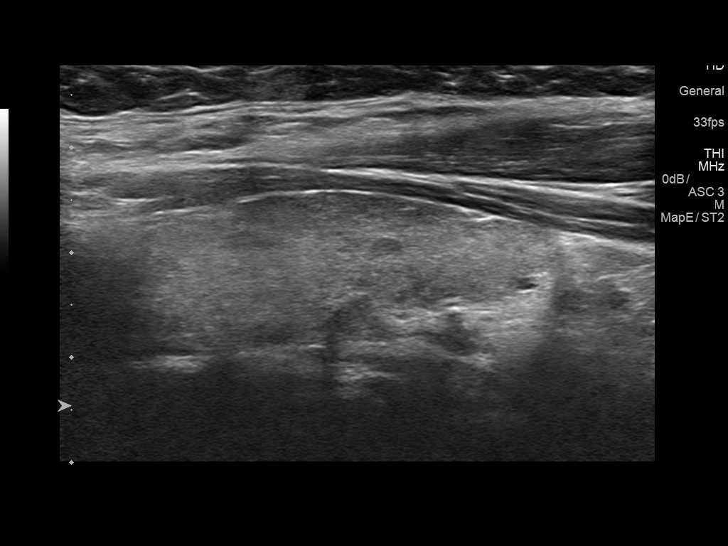
[im 13/37]
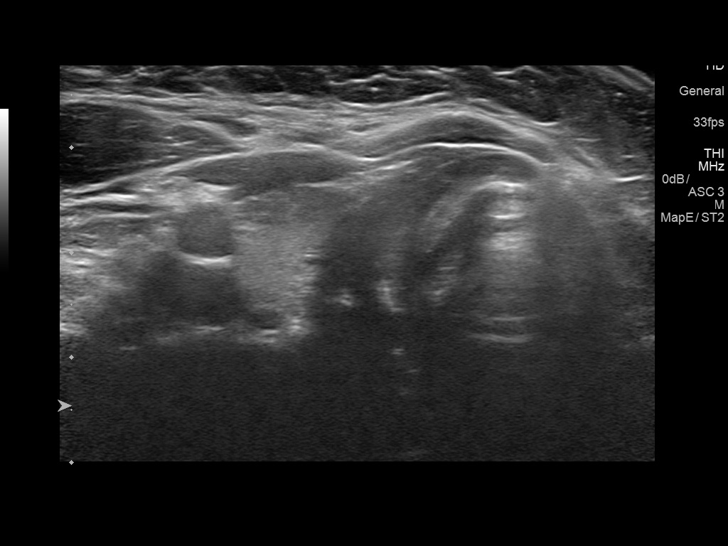
[im 14/37]
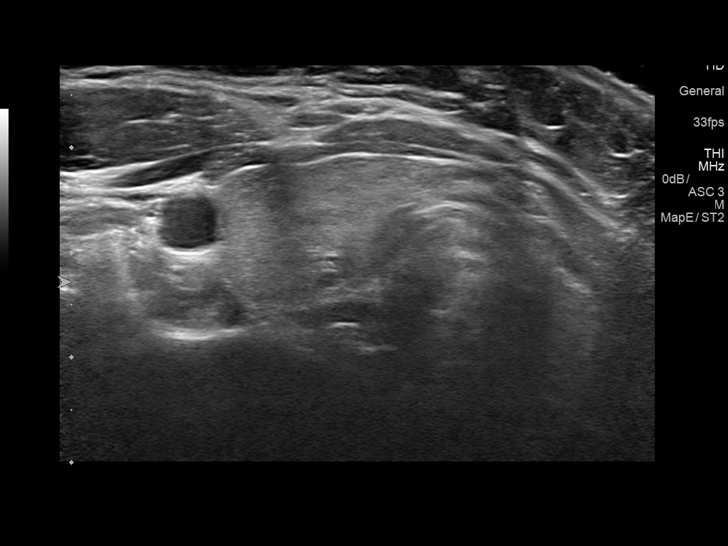
[im 17/37]
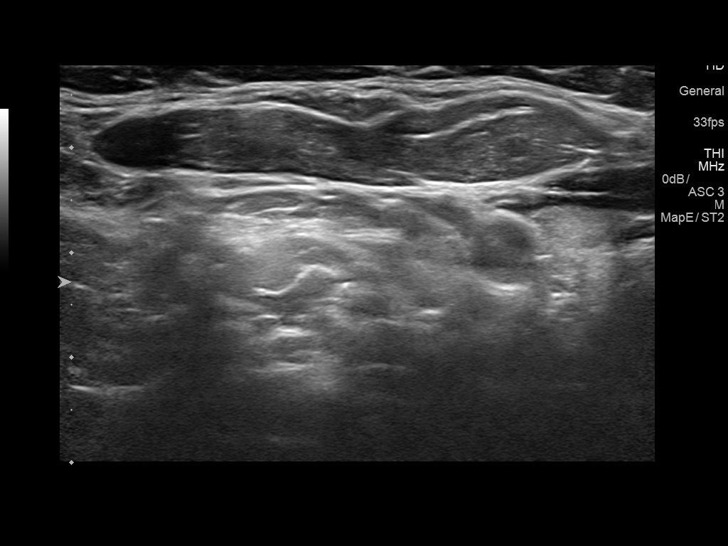
[im 20/37]
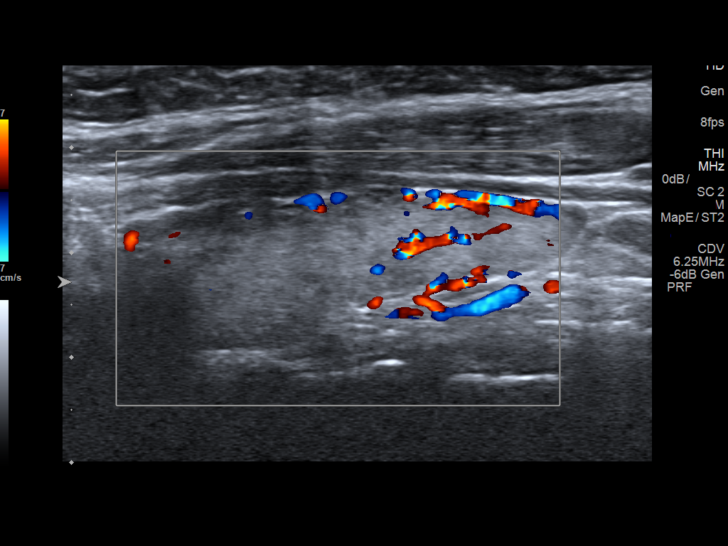
[im 23/37]
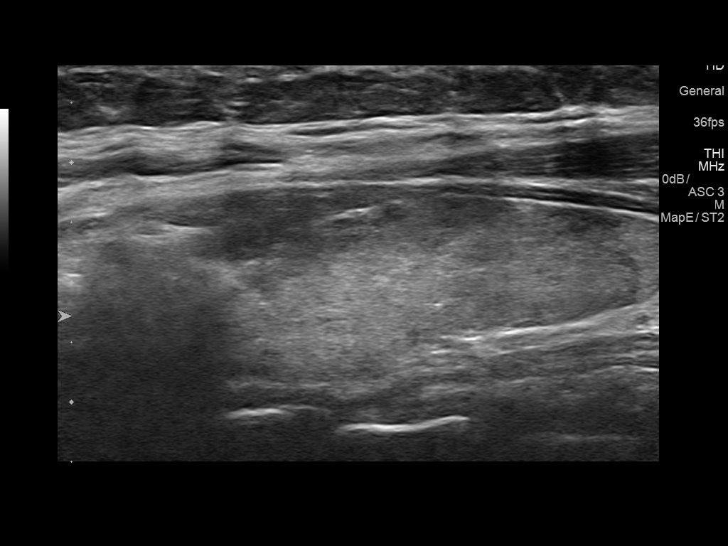
[im 25/37]
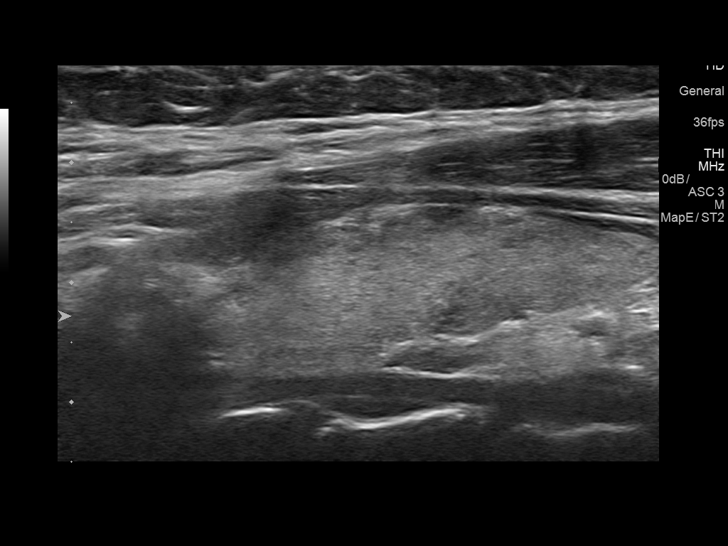
[im 28/37]
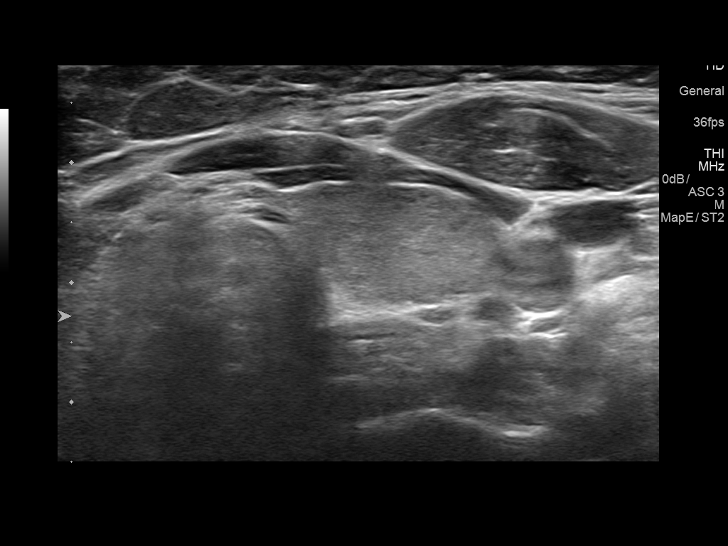
[im 31/37]
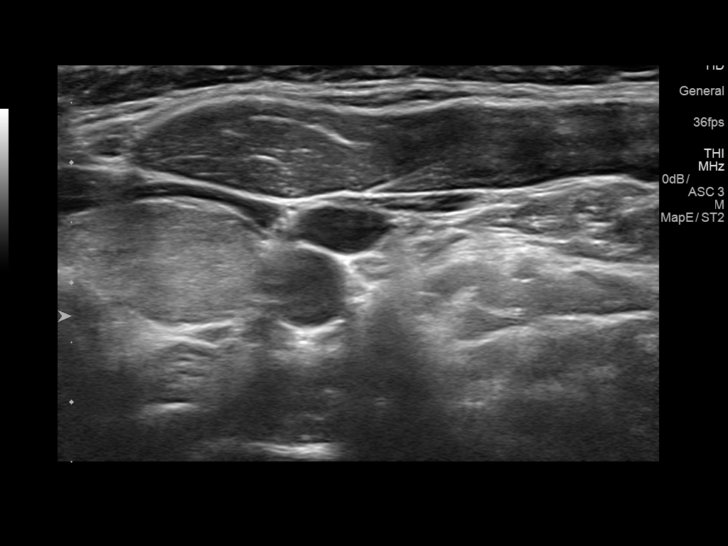
[im 34/37]
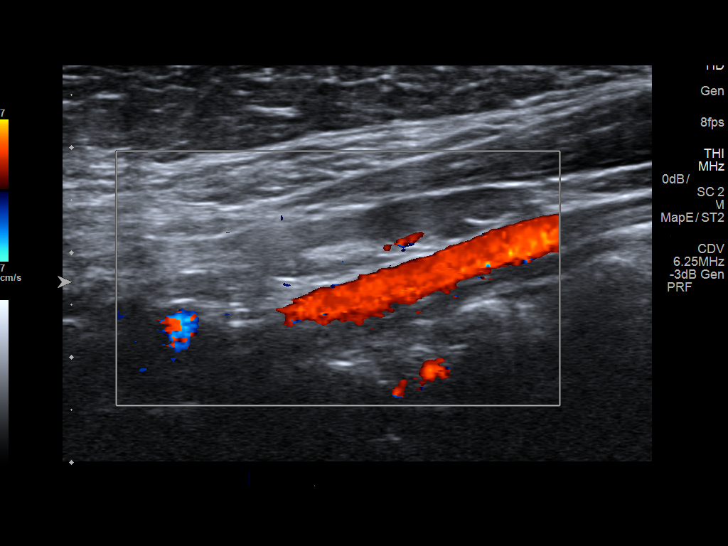
[im 37/37]
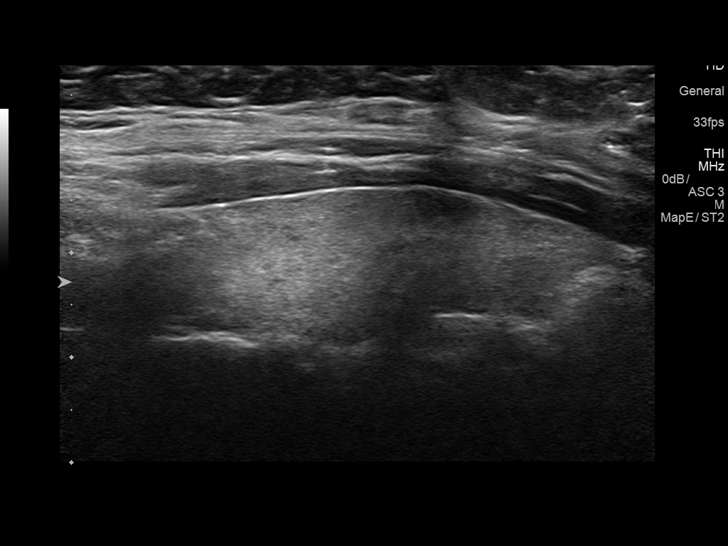

[14 of 25 positions shown; findings below may reference images not displayed]

FINDINGS: Parenchymal Echotexture: Mildly heterogenous

Isthmus: 0.3 cm

Right lobe: 4.7 x 1.6 x 1.7 cm

Left lobe: 4.1 x 1.4 x 1.7 cm

_________________________________________________________

Estimated total number of nodules >/= 1 cm: 0

Number of spongiform nodules >/=  2 cm not described below (TR1): 0

Number of mixed cystic and solid nodules >/= 1.5 cm not described
below (TR2): 0

_________________________________________________________

No discrete nodules are seen within the thyroid gland.
IMPRESSION: The gland is mildly heterogeneous and without focal nodule. This
study does not meet criteria for follow-up or biopsy.

The above is in keeping with the ACR TI-RADS recommendations - [HOSPITAL] 7650;[DATE].

## 2019-09-02 ENCOUNTER — Other Ambulatory Visit: Payer: Self-pay

## 2019-09-02 DIAGNOSIS — Z20822 Contact with and (suspected) exposure to covid-19: Secondary | ICD-10-CM

## 2019-09-04 LAB — NOVEL CORONAVIRUS, NAA: SARS-CoV-2, NAA: NOT DETECTED

## 2020-01-30 ENCOUNTER — Ambulatory Visit: Payer: Self-pay | Attending: Internal Medicine

## 2020-01-30 DIAGNOSIS — Z23 Encounter for immunization: Secondary | ICD-10-CM

## 2020-01-30 NOTE — Progress Notes (Signed)
   Covid-19 Vaccination Clinic  Name:  Morgan Ward    MRN: [id number] DOB: February 14, 1991  01/30/2020  Morgan Ward was observed post Covid-19 immunization for 15 minutes without incident. She was provided with Vaccine Information Sheet and instruction to access the V-Safe system.   Morgan Ward was instructed to call 911 with any severe reactions post vaccine: Marland Kitchen Difficulty breathing  . Swelling of face and throat  . A fast heartbeat  . A bad rash all over body  . Dizziness and weakness   Immunizations Administered    Name Date Dose VIS Date Route   Pfizer COVID-19 Vaccine 01/30/2020  5:54 PM 0.3 mL 10/16/2019 Intramuscular   Manufacturer: [employer], [employer]   Lot: [id number]   NDC: [address]-6

## 2020-02-20 ENCOUNTER — Ambulatory Visit: Payer: Self-pay | Attending: Internal Medicine

## 2020-02-20 DIAGNOSIS — Z23 Encounter for immunization: Secondary | ICD-10-CM

## 2020-02-20 NOTE — Progress Notes (Signed)
   Covid-19 Vaccination Clinic  Name:  Morgan Ward    MRN: [id number] DOB: 10-Nov-1990  02/20/2020  Morgan Ward was observed post Covid-19 immunization for 15 minutes without incident. She was provided with Vaccine Information Sheet and instruction to access the V-Safe system.   Morgan Ward was instructed to call 911 with any severe reactions post vaccine: Marland Kitchen Difficulty breathing  . Swelling of face and throat  . A fast heartbeat  . A bad rash all over body  . Dizziness and weakness   Immunizations Administered    Name Date Dose VIS Date Route   Pfizer COVID-19 Vaccine 02/20/2020 11:00 AM 0.3 mL 10/16/2019 Intramuscular   Manufacturer: [employer], [employer]   Lot: [id number]   NDC: [address]-1

## 2021-07-06 ENCOUNTER — Other Ambulatory Visit: Payer: Self-pay | Admitting: Orthopedic Surgery

## 2021-08-22 ENCOUNTER — Emergency Department (HOSPITAL_COMMUNITY)
Admission: EM | Admit: 2021-08-22 | Discharge: 2021-08-23 | Disposition: A | Discharge status: Home / Self Care | Payer: Medicaid Other | Attending: Emergency Medicine | Admitting: Emergency Medicine

## 2021-08-22 ENCOUNTER — Other Ambulatory Visit: Payer: Self-pay

## 2021-08-22 ENCOUNTER — Encounter (HOSPITAL_COMMUNITY): Payer: Self-pay

## 2021-08-22 DIAGNOSIS — K529 Noninfective gastroenteritis and colitis, unspecified: Secondary | ICD-10-CM | POA: Diagnosis not present

## 2021-08-22 DIAGNOSIS — E119 Type 2 diabetes mellitus without complications: Secondary | ICD-10-CM | POA: Diagnosis not present

## 2021-08-22 DIAGNOSIS — Z20822 Contact with and (suspected) exposure to covid-19: Secondary | ICD-10-CM | POA: Insufficient documentation

## 2021-08-22 DIAGNOSIS — R197 Diarrhea, unspecified: Secondary | ICD-10-CM | POA: Diagnosis present

## 2021-08-22 DIAGNOSIS — Z79899 Other long term (current) drug therapy: Secondary | ICD-10-CM | POA: Diagnosis not present

## 2021-08-22 HISTORY — DX: Type 2 diabetes mellitus without complications: E11.9

## 2021-08-22 LAB — COMPREHENSIVE METABOLIC PANEL
ALT: 25 U/L (ref 0–44)
AST: 16 U/L (ref 15–41)
Albumin: 4.5 g/dL (ref 3.5–5.0)
Alkaline Phosphatase: 63 U/L (ref 38–126)
Anion gap: 10 (ref 5–15)
BUN: 9 mg/dL (ref 6–20)
CO2: 23 mmol/L (ref 22–32)
Calcium: 9 mg/dL (ref 8.9–10.3)
Chloride: 105 mmol/L (ref 98–111)
Creatinine, Ser: 0.81 mg/dL (ref 0.44–1.00)
GFR, Estimated: >60 mL/min (ref >60)
Glucose, Bld: 104 mg/dL — ABNORMAL HIGH (ref 70–99)
Potassium: 4 mmol/L (ref 3.5–5.1)
Sodium: 138 mmol/L (ref 135–145)
Total Bilirubin: 0.6 mg/dL (ref 0.3–1.2)
Total Protein: 8.3 g/dL — ABNORMAL HIGH (ref 6.5–8.1)

## 2021-08-22 LAB — URINALYSIS, ROUTINE W REFLEX MICROSCOPIC
Bacteria, UA: NONE SEEN
Bilirubin Urine: NEGATIVE
Glucose, UA: NEGATIVE mg/dL
Ketones, ur: 5 mg/dL — AB
Nitrite: NEGATIVE
Protein, ur: NEGATIVE mg/dL
Specific Gravity, Urine: 1.027 (ref 1.005–1.030)
pH: 5 (ref 5.0–8.0)

## 2021-08-22 LAB — CBC
HCT: 44.7 % (ref 36.0–46.0)
Hemoglobin: 14.6 g/dL (ref 12.0–15.0)
MCH: 28.5 pg (ref 26.0–34.0)
MCHC: 32.7 g/dL (ref 30.0–36.0)
MCV: 87.3 fL (ref 80.0–100.0)
Platelets: 387 10*3/uL (ref 150–400)
RBC: 5.12 MIL/uL — ABNORMAL HIGH (ref 3.87–5.11)
RDW: 13.2 % (ref 11.5–15.5)
WBC: 13.1 10*3/uL — ABNORMAL HIGH (ref 4.0–10.5)
nRBC: 0 % (ref 0.0–0.2)

## 2021-08-22 LAB — I-STAT BETA HCG BLOOD, ED (MC, WL, AP ONLY): I-stat hCG, quantitative: <5 m[IU]/mL (ref <5)

## 2021-08-22 LAB — LIPASE, BLOOD: Lipase: 26 U/L (ref 11–51)

## 2021-08-22 LAB — RESP PANEL BY RT-PCR (FLU A&B, COVID) ARPGX2
Influenza A by PCR: NEGATIVE
Influenza B by PCR: NEGATIVE
SARS Coronavirus 2 by RT PCR: NEGATIVE

## 2021-08-22 MED ORDER — PROCHLORPERAZINE MALEATE 10 MG PO TABS
10.0000 mg | ORAL_TABLET | Freq: Once | ORAL | Status: AC
  Administered 2021-08-22: 10 mg via ORAL
  Filled 2021-08-22: qty 1

## 2021-08-22 MED ORDER — PROMETHAZINE HCL 25 MG RE SUPP: 25.0000 mg | Freq: Four times a day (QID) | RECTAL | 0 refills | Status: AC | PRN

## 2021-08-22 MED ORDER — ONDANSETRON 4 MG PO TBDP
4.0000 mg | ORAL_TABLET | Freq: Once | ORAL | Status: AC
  Administered 2021-08-22: 4 mg via ORAL
  Filled 2021-08-22: qty 1

## 2021-08-22 NOTE — ED Provider Notes (Signed)
[facility]-EMERGENCY DEPT Provider Note   CSN: [id number] Arrival date & time: 08/22/21  1626     History Chief Complaint  Patient presents with   Abdominal Pain   Emesis   Diarrhea    Morgan Ward is a 30 y.o. female who presents with concern for epigastric pain, nausea, vomiting, diarrhea x4 days.  Endorses 3-5 episodes of NBNB emesis daily and 10-12 episodes of watery diarrhea daily without melena or hematochezia.  Was seen at her PCP yesterday and prescribed Zofran which did not help.  Denies any urinary symptoms.  I personally reviewed this patient's medical records.  She is history of PCOS, obesity, and depression.  He is on BuSpar and hydroxyzine daily as well as Zoloft.  HPI     Past Medical History:  Diagnosis Date   Allergic rhinitis, cause unspecified    Anxiety    Depression    Depression    Diabetes mellitus without complication (HCC)    History of angioedema    Nephrolithiasis    Obesity    PCOS (polycystic ovarian syndrome)    Recurrent herpes labialis     Patient Active Problem List   Diagnosis Date Noted   S/P cesarean section 11/23/2016   Allergic rhinitis, cause unspecified    PCOS (polycystic ovarian syndrome)    Nephrolithiasis    Depression with anxiety    BMI 40.0-44.9, adult (HCC)    Recurrent herpes labialis     Past Surgical History:  Procedure Laterality Date   CESAREAN SECTION     CESAREAN SECTION N/A 11/23/2016   Procedure: CESAREAN SECTION;  Surgeon: Zelphia Cairo, MD;  Location: [facility];  Service: Obstetrics;  Laterality: N/A;  Repeat edc 11/30/16 nkda need RNFA     OB History     Gravida  2   Para  2   Term  2   Preterm      AB      Living  2      SAB      IAB      Ectopic      Multiple  0   Live Births  2           Family History  Problem Relation Age of Onset   Diverticulitis Mother    Diabetes Mother    Hypertension Mother    Hypertension Father     Diabetes Maternal Grandmother    Diabetes Maternal Aunt     Social History   Tobacco Use   Smoking status: Never   Smokeless tobacco: Never  Vaping Use   Vaping Use: Never used  Substance Use Topics   Alcohol use: No   Drug use: No    Home Medications Prior to Admission medications   Medication Sig Start Date End Date Taking? Authorizing Provider  promethazine (PHENERGAN) 25 MG suppository Place 1 suppository (25 mg total) rectally every 6 (six) hours as needed for nausea or vomiting. 08/22/21  Yes Que Meneely, Eugene Gavia, PA-C  acetaminophen (TYLENOL) 325 MG tablet Take 650 mg by mouth every 6 (six) hours as needed (for pain.).    [provider]  busPIRone (BUSPAR) 15 MG tablet Take 1 tablet (15 mg total) by mouth 3 (three) times daily. Needs ov for additional refills 10/13/17   Porfirio Oar, PA  hydrOXYzine (ATARAX/VISTARIL) 25 MG tablet Take 0.5-1 tablets (12.5-25 mg total) by mouth every 8 (eight) hours as needed for anxiety. 07/30/17   Porfirio Oar, PA  nitrofurantoin, macrocrystal-monohydrate, (  MACROBID) 100 MG capsule Take 1 capsule (100 mg total) by mouth 2 (two) times daily. 11/25/17   Noni Saupe, MD  sertraline (ZOLOFT) 100 MG tablet Take 100 mg by mouth daily.    [provider]    Allergies    Wellbutrin [bupropion hcl]  Review of Systems   Review of Systems  Constitutional:  Positive for activity change, appetite change, chills and fatigue. Negative for fever.  HENT: Negative.    Respiratory: Negative.    Cardiovascular: Negative.   Gastrointestinal:  Positive for abdominal pain, diarrhea, nausea and vomiting.  Genitourinary: Negative.   Musculoskeletal: Negative.   Skin: Negative.   Neurological: Negative.    Physical Exam Updated Vital Signs BP 124/79 (BP Location: Left Arm)   Pulse 82   Temp 98.1 F (36.7 C) (Oral)   Resp 18   Ht 5' (1.524 m)   Wt 122.5 kg   SpO2 98%   BMI 52.73 kg/m   Physical Exam Vitals and  nursing note reviewed.  Constitutional:      Appearance: She is obese. She is not ill-appearing or toxic-appearing.  HENT:     Head: Normocephalic and atraumatic.     Nose: Nose normal.     Mouth/Throat:     Mouth: Mucous membranes are moist.     Pharynx: Oropharynx is clear. Uvula midline. No oropharyngeal exudate or posterior oropharyngeal erythema.     Tonsils: No tonsillar exudate.  Eyes:     General: Lids are normal. Vision grossly intact.        Right eye: No discharge.        Left eye: No discharge.     Extraocular Movements: Extraocular movements intact.     Conjunctiva/sclera: Conjunctivae normal.     Pupils: Pupils are equal, round, and reactive to light.  Neck:     Trachea: Trachea and phonation normal.  Cardiovascular:     Rate and Rhythm: Normal rate and regular rhythm.     Pulses: Normal pulses.     Heart sounds: Normal heart sounds.  Pulmonary:     Effort: Pulmonary effort is normal. No tachypnea, bradypnea, accessory muscle usage, prolonged expiration or respiratory distress.     Breath sounds: Normal breath sounds. No wheezing or rales.  Chest:     Chest wall: No mass, lacerations, deformity, swelling, tenderness, crepitus or edema.  Abdominal:     General: Bowel sounds are normal. There is no distension.     Palpations: Abdomen is soft.     Tenderness: There is abdominal tenderness in the epigastric area. There is no right CVA tenderness, left CVA tenderness, guarding or rebound.  Musculoskeletal:        General: No deformity.     Cervical back: Normal range of motion and neck supple. No edema, rigidity or crepitus. No pain with movement, spinous process tenderness or muscular tenderness.  Lymphadenopathy:     Cervical: No cervical adenopathy.  Skin:    General: Skin is warm and dry.     Findings: No rash.  Neurological:     Mental Status: She is alert and oriented to person, place, and time. Mental status is at baseline.     GCS: GCS eye subscore is 4. GCS  verbal subscore is 5. GCS motor subscore is 6.     Gait: Gait is intact.  Psychiatric:        Mood and Affect: Mood normal.    ED Results / Procedures / Treatments   Labs (  all labs ordered are listed, but only abnormal results are displayed) Labs Reviewed  COMPREHENSIVE METABOLIC PANEL - Abnormal; Notable for the following components:      Result Value   Glucose, Bld 104 (*)    Total Protein 8.3 (*)    All other components within normal limits  CBC - Abnormal; Notable for the following components:   WBC 13.1 (*)    RBC 5.12 (*)    All other components within normal limits  URINALYSIS, ROUTINE W REFLEX MICROSCOPIC - Abnormal; Notable for the following components:   APPearance HAZY (*)    Hgb urine dipstick LARGE (*)    Ketones, ur 5 (*)    Leukocytes,Ua MODERATE (*)    All other components within normal limits  RESP PANEL BY RT-PCR (FLU A&B, COVID) ARPGX2  LIPASE, BLOOD  I-STAT BETA HCG BLOOD, ED ([facility], [facility], [facility] ONLY)    EKG EKG: sinus rhythm, normal QTC.  Radiology No results found.  Procedures Procedures   Medications Ordered in ED Medications  ondansetron (ZOFRAN-ODT) disintegrating tablet 4 mg (4 mg Oral Given 08/22/21 2212)  prochlorperazine (COMPAZINE) tablet 10 mg (10 mg Oral Given 08/22/21 2357)    ED Course  I have reviewed the triage vital signs and the nursing notes.  Pertinent labs & imaging results that were available during my care of the patient were reviewed by me and considered in my medical decision making (see chart for details).    MDM Rules/Calculators/A&P                         30 year old female presents with concern for 4 days of nausea, vomiting, diarrhea without fever.  The differential diagnosis of diarrhea includes but is not limited to: Viral: norovirus/rotavirus; other viral gastroenteritis Bacterial-Campylobacter,Shigella, Salmonella, Escherichia coli, E. coli 0157:H7, Yersinia enterocolitica, Vibrio cholerae, Clostridium  difficile. Parasitic- Giardia lamblia, Cryptosporidium,Entamoeba histolytica,Cyclospora, Microsporidium. Toxin- Staphylococcus aureus, Bacillus cereus.  Noninfectious causes include GI Bleed, Appendicitis, Mesenteric Ischemia, Diverticulitis, Adrenal Crisis, Thyroid Storm, Toxicologic exposures, Antibiotic or drug-associated, inflammatory bowel disease.  Hypertensive on intake, vitals otherwise normal.  Cardiopulmonary exam is normal, abdominal exam is significant for epigastric tenderness palpation.  She is neurovascular intact in all 4 extremities.  No CVA tenderness.  CBC with mild leukocytosis of 13,000.  CMP unremarkable..  Lipase is normal.  Patient is not pregnant and urine is not concerning for infection.  Respiratory pathogen panel negative.  Patient administered Zofran with very mild improvement in her nausea; only was able to keep down a few sips of water.  We will try oral Compazine.  No medication for IV rehydration or IV antiemetic given patient is tolerating p.o. at this time and does not appear clinically dehydrated.  Patient requesting be discharged after administration of oral Compazine.  EKG obtained as above with normal QTC.  Will discharge with Phenergan suppositories as needed.  Recommend increase hydration with electrolyte drinks.  Suspect acute viral gastroenteritis as her etiology of her symptoms.  No further work-up warranted in the ER at this time.  Jasmin voiced understanding of her medical evaluation and treat blood.  For questions was answered to her expressed satisfaction.  Return precautions were given.  Patient is well-appearing, stable, and appropriate for discharge at this time.  This chart was dictated using voice recognition software, Dragon. Despite the best efforts of this provider to proofread and correct errors, errors may still occur which can change documentation meaning.  Final Clinical Impression(s) / ED Diagnoses  Final diagnoses:  Gastroenteritis     Rx / DC Orders ED Discharge Orders          Ordered    promethazine (PHENERGAN) 25 MG suppository  Every 6 hours PRN        08/22/21 2338             Maximillion Gill, Eugene Gavia, PA-C 08/23/21 0024    Derwood Kaplan, MD 08/29/21 1054

## 2021-08-22 NOTE — ED Triage Notes (Signed)
Patient c/o constant mid upper abdominal pain, N/V/D x 4 days. Patient states she saw her PCp yesterday and was given an Rx for zofran and states no relief.

## 2021-08-22 NOTE — Discharge Instructions (Signed)
CausingYou are seen in the ER today for your nausea, vomiting, and diarrhea.  Your physical exam and blood work were very reassuring.  Suspect you have an acute viral illness your vomiting and diarrhea.  You may take over-the-counter Imodium as needed and you have been prescribed a nausea medication to take at home.  You may try your Zofran first and if it does not work you may use the Phenergan suppositories you have been prescribed.  Please continue to drink lots of fluids and incorporate electrolyte drinks.  Follow-up with your primary care doctor return to the ER with any new severe symptoms.

## 2021-08-22 NOTE — ED Provider Notes (Signed)
Emergency Medicine Provider Triage Evaluation Note  RIDLEY DILEO , a 30 y.o. female  was evaluated in triage.  Pt complains of epigastric abdominal pain and intractable nausea and vomiting over the last 3 days.  She was seen at her primary care provider couple days ago and was prescribed Zofran which offered little improvement.  She reports associated diarrhea but denies any subjective fever or chills.  No chest pain or shortness of breath.  She states her coworkers have been sick with similar symptoms.  Review of Systems  Positive:  Negative: See above   Physical Exam  BP (!) 158/107 (BP Location: Right Arm)   Pulse 94   Temp 97.8 F (36.6 C) (Oral)   Resp 16   Ht 5' (1.524 m)   Wt 122.5 kg   SpO2 97%   BMI 52.73 kg/m  Gen:   Awake, no distress   Resp:  Normal effort  MSK:   Moves extremities without difficulty  Other:  Abdomen is nontender to palpation.  Medical Decision Making  Medically screening exam initiated at 5:17 PM.  Appropriate orders placed.  LOUISIANA SEARLES was informed that the remainder of the evaluation will be completed by another provider, this initial triage assessment does not replace that evaluation, and the importance of remaining in the ED until their evaluation is complete.     Honor Loh [place], PA-C 08/22/21 1719    Terrilee Files, MD 08/23/21 [phone]

## 2021-08-24 ENCOUNTER — Encounter (HOSPITAL_COMMUNITY): Admission: RE | Payer: Self-pay | Source: Home / Self Care

## 2021-08-24 ENCOUNTER — Ambulatory Visit (HOSPITAL_COMMUNITY): Admission: RE | Admit: 2021-08-24 | Payer: Medicaid Other | Source: Home / Self Care | Admitting: Orthopedic Surgery

## 2021-08-24 SURGERY — CARPAL TUNNEL RELEASE
Anesthesia: Regional | Laterality: Right

## 2023-11-22 ENCOUNTER — Encounter (HOSPITAL_BASED_OUTPATIENT_CLINIC_OR_DEPARTMENT_OTHER): Payer: Self-pay

## 2023-11-22 ENCOUNTER — Emergency Department (HOSPITAL_BASED_OUTPATIENT_CLINIC_OR_DEPARTMENT_OTHER): Payer: Medicaid Other

## 2023-11-22 ENCOUNTER — Emergency Department (HOSPITAL_BASED_OUTPATIENT_CLINIC_OR_DEPARTMENT_OTHER)
Admission: EM | Admit: 2023-11-22 | Discharge: 2023-11-22 | Disposition: A | Discharge status: Home / Self Care | Payer: Medicaid Other | Attending: Emergency Medicine | Admitting: Emergency Medicine

## 2023-11-22 ENCOUNTER — Other Ambulatory Visit: Payer: Self-pay

## 2023-11-22 DIAGNOSIS — Z23 Encounter for immunization: Secondary | ICD-10-CM | POA: Insufficient documentation

## 2023-11-22 DIAGNOSIS — S060X0A Concussion without loss of consciousness, initial encounter: Secondary | ICD-10-CM | POA: Insufficient documentation

## 2023-11-22 DIAGNOSIS — S01112A Laceration without foreign body of left eyelid and periocular area, initial encounter: Secondary | ICD-10-CM | POA: Diagnosis not present

## 2023-11-22 DIAGNOSIS — S0993XA Unspecified injury of face, initial encounter: Secondary | ICD-10-CM | POA: Diagnosis present

## 2023-11-22 DIAGNOSIS — S0083XA Contusion of other part of head, initial encounter: Secondary | ICD-10-CM

## 2023-11-22 MED ORDER — TETANUS-DIPHTH-ACELL PERTUSSIS 5-2.5-18.5 LF-MCG/0.5 IM SUSY
0.5000 mL | PREFILLED_SYRINGE | Freq: Once | INTRAMUSCULAR | Status: AC
  Administered 2023-11-22: 0.5 mL via INTRAMUSCULAR
  Filled 2023-11-22: qty 0.5
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
## 2024-07-29 ENCOUNTER — Ambulatory Visit (HOSPITAL_COMMUNITY)
Admission: RE | Admit: 2024-07-29 | Discharge: 2024-07-29 | Disposition: A | Discharge status: Home / Self Care | Payer: Self-pay | Source: Ambulatory Visit | Attending: Family Medicine | Admitting: Family Medicine

## 2024-07-29 ENCOUNTER — Other Ambulatory Visit: Payer: Self-pay

## 2024-07-29 ENCOUNTER — Encounter (HOSPITAL_COMMUNITY): Payer: Self-pay

## 2024-07-29 VITALS — BP 132/85 | HR 78 | Temp 98.0°F | Resp 20

## 2024-07-29 DIAGNOSIS — J0100 Acute maxillary sinusitis, unspecified: Secondary | ICD-10-CM

## 2024-07-29 MED ORDER — AMOXICILLIN 875 MG PO TABS: 875.0000 mg | ORAL_TABLET | Freq: Two times a day (BID) | ORAL | 0 refills | Status: AC

## 2024-07-29 NOTE — ED Provider Notes (Signed)
 [facility]   [id number] 07/29/24 Arrival Time: 1620  ASSESSMENT & PLAN:  1. Acute non-recurrent maxillary sinusitis    Begin: Meds ordered this encounter  Medications   amoxicillin  (AMOXIL ) 875 MG tablet    Sig: Take 1 tablet (875 mg total) by mouth 2 (two) times daily for 10 days.    Dispense:  20 tablet    Refill:  0   OTC symptom care as needed. Ensure adequate fluid intake and rest.   Follow-up Information     Cristopher Suzen HERO, NP.   Why: As needed. Contact information: [address] [place] [address] [phone]                 Reviewed expectations re: course of current medical issues. Questions answered. Outlined signs and symptoms indicating need for more acute intervention. Patient verbalized understanding. After Visit Summary given.   SUBJECTIVE: History from: patient.  Morgan Ward is a 33 y.o. female who presents with complaint of nasal congestion, post-nasal drainage, and sinus pain. Onset gradual, a week ago. Respiratory symptoms: none. Fever: denied. Overall normal PO intake without n/v. OTC treatment: none. Seasonal allergies: no. History of frequent sinus infections: no. No specific aggravating or alleviating factors reported. Social History   Tobacco Use  Smoking Status Never  Smokeless Tobacco Never    OBJECTIVE:  Vitals:   07/29/24 1640  BP: 132/85  Pulse: 78  Resp: 20  Temp: 98 F (36.7 C)  TempSrc: Oral  SpO2: 96%     General appearance: alert; no distress HEENT: nasal congestion; clear runny nose; throat irritation secondary to post-nasal drainage; bilateral maxillary tenderness to palpation; turbinates boggy Neck: supple without LAD; trachea midline Lungs: unlabored respirations, symmetrical air entry; cough: absent; no respiratory distress Skin: warm and dry Psychological: alert and cooperative; normal mood and affect  Allergies  Allergen Reactions   Bupropion Hcl Other (See Comments)    Phentermine Hcl Palpitations    Past Medical History:  Diagnosis Date   Allergic rhinitis, cause unspecified    Anxiety    Depression    Depression    Diabetes mellitus without complication (HCC)    History of angioedema    Nephrolithiasis    Obesity    PCOS (polycystic ovarian syndrome)    Recurrent herpes labialis    Family History  Problem Relation Age of Onset   Diverticulitis Mother    Diabetes Mother    Hypertension Mother    Hypertension Father    Diabetes Maternal Grandmother    Diabetes Maternal Aunt    Social History   Socioeconomic History   Marital status: Single    Spouse name: Not on file   Number of children: 2   Years of education: Not on file   Highest education level: Not on file  Occupational History   Occupation: Product manager: [employer]  Tobacco Use   Smoking status: Never   Smokeless tobacco: Never  Vaping Use   Vaping status: Never Used  Substance and Sexual Activity   Alcohol use: No   Drug use: No   Sexual activity: Yes    Birth control/protection: None  Other Topics Concern   Not on file  Social History Narrative   Living with her son and daughter.   Mother lives nearby.   Social Drivers of Corporate investment banker Strain: Not on file  Food Insecurity: Not on file  Transportation Needs: Not on file  Physical Activity:  Not on file  Stress: Not on file  Social Connections: Not on file  Intimate Partner Violence: Not on file             Rolinda Rogue, MD 07/29/24 1836

## 2024-07-29 NOTE — ED Triage Notes (Signed)
 Patient reports symptoms started one week ago.  Patient patient has runny nose, head congestion, headache, fatigue and now throat is starting to get sore.    Patient has not taken any medications for symptoms

## 2024-08-27 ENCOUNTER — Ambulatory Visit (HOSPITAL_COMMUNITY): Payer: Self-pay

## 2024-08-31 ENCOUNTER — Ambulatory Visit (HOSPITAL_COMMUNITY): Payer: Self-pay

## 2024-09-02 ENCOUNTER — Ambulatory Visit (HOSPITAL_COMMUNITY)
Admission: RE | Admit: 2024-09-02 | Discharge: 2024-09-02 | Disposition: A | Discharge status: Home / Self Care | Payer: Self-pay | Source: Ambulatory Visit | Attending: Internal Medicine | Admitting: Internal Medicine

## 2024-09-02 ENCOUNTER — Telehealth (HOSPITAL_COMMUNITY): Payer: Self-pay

## 2024-09-02 ENCOUNTER — Other Ambulatory Visit: Payer: Self-pay

## 2024-09-02 ENCOUNTER — Encounter (HOSPITAL_COMMUNITY): Payer: Self-pay

## 2024-09-02 VITALS — BP 132/92 | HR 85 | Temp 97.5°F | Resp 20

## 2024-09-02 DIAGNOSIS — H109 Unspecified conjunctivitis: Secondary | ICD-10-CM | POA: Diagnosis present

## 2024-09-02 DIAGNOSIS — Z113 Encounter for screening for infections with a predominantly sexual mode of transmission: Secondary | ICD-10-CM | POA: Insufficient documentation

## 2024-09-02 DIAGNOSIS — J3089 Other allergic rhinitis: Secondary | ICD-10-CM | POA: Insufficient documentation

## 2024-09-02 MED ORDER — FLUORESCEIN SODIUM 1 MG OP STRP
ORAL_STRIP | OPHTHALMIC | Status: AC
  Filled 2024-09-02: qty 1

## 2024-09-02 MED ORDER — MOXIFLOXACIN HCL 0.5 % OP SOLN: 1.0000 [drp] | Freq: Four times a day (QID) | OPHTHALMIC | 0 refills | Status: AC

## 2024-09-02 MED ORDER — TETRACAINE HCL 0.5 % OP SOLN
OPHTHALMIC | Status: AC
  Filled 2024-09-02: qty 4

## 2024-09-02 MED ORDER — NEOMYCIN-POLYMYXIN-DEXAMETH 0.1 % OP OINT: 1.0000 | TOPICAL_OINTMENT | Freq: Two times a day (BID) | OPHTHALMIC | 0 refills | Status: AC

## 2024-09-02 MED ORDER — EYE WASH OP SOLN
OPHTHALMIC | Status: AC
  Filled 2024-09-02: qty 118

## 2024-09-02 MED ORDER — NEOMYCIN-POLYMYXIN-DEXAMETH 0.1 % OP OINT: 1.0000 | TOPICAL_OINTMENT | Freq: Two times a day (BID) | OPHTHALMIC | 0 refills | Status: DC

## 2024-09-02 MED ORDER — MOXIFLOXACIN HCL 0.5 % OP SOLN: 1.0000 [drp] | Freq: Four times a day (QID) | OPHTHALMIC | 0 refills | Status: DC

## 2024-09-02 NOTE — Medical Student Note (Signed)
 [facility] Insurance Account Manager Note For educational purposes for Medical, PA and NP students only and not part of the legal medical record.   CSN: [id number] Arrival date & time: 09/02/24  0917      History   Chief Complaint Chief Complaint  Patient presents with   Eye Problem    Eye is red and itchy and watery - Entered by patient   Appointment    9:30    HPI Morgan Ward is a 33 y.o. female.  Pt has a hx of anxiety and seasonal allergies. Today she presents with 2 weeks of conjunctivitis with drainage to the R eye with pruritus.  In the morning when she wakes up she has green crusting and then clear watery drainage throughout the day. She reports some mild tenderness to the medial canthus otherwise the eye is not painful. She denies photophobia or vision changes other than some blurred vision after application of the erythromycin ointment. She does have some clear nasal drainage and she takes levocetirizine daily.  The L eye is asymptomatic. She did a video visit at onset and was prescribed erythromycin ointment 4 times a day for 7 days. She reports completing the course and was compliant with the instructions. They symptoms have not resolved and remain unchanged since onset. No one else in the household has symptoms. She is sexually active with a single partner for the last 4 months with no concerns of STI infections. She denies any vaginal symptoms.   The history is provided by the patient.  Eye Problem Associated symptoms: discharge   Associated symptoms: no headaches and no photophobia     Past Medical History:  Diagnosis Date   Allergic rhinitis, cause unspecified    Anxiety    Depression    Depression    Diabetes mellitus without complication (HCC)    History of angioedema    Nephrolithiasis    Obesity    PCOS (polycystic ovarian syndrome)    Recurrent herpes labialis     Patient Active Problem List   Diagnosis Date Noted   S/P cesarean section  11/23/2016   Allergic rhinitis    PCOS (polycystic ovarian syndrome)    Nephrolithiasis    Depression with anxiety    BMI 40.0-44.9, adult (HCC)    Recurrent herpes labialis     Past Surgical History:  Procedure Laterality Date   CESAREAN SECTION     CESAREAN SECTION N/A 11/23/2016   Procedure: CESAREAN SECTION;  Surgeon: Truman Corona, MD;  Location: [facility]IRTHING SUIT[facility];  Service: Obstetrics;  Laterality: N/A;  Repeat edc 11/30/16 nkda need RNFA    OB History     Gravida  2   Para  2   Term  2   Preterm      AB      Living  2      SAB      IAB      Ectopic      Multiple  0   Live Births  2            Home Medications    Prior to Admission medications   Medication Sig Start Date End Date Taking? Authorizing Provider  acetaminophen  (TYLENOL ) 325 MG tablet Take 650 mg by mouth every 6 (six) hours as needed (for pain.).    [provider]  escitalopram (LEXAPRO) 10 MG tablet Take 10 mg by mouth daily.    [provider]  hydrOXYzine  (ATARAX /VISTARIL ) 25 MG tablet  Take 0.5-1 tablets (12.5-25 mg total) by mouth every 8 (eight) hours as needed for anxiety. 07/30/17   Juliane Che, PA  nitrofurantoin , macrocrystal-monohydrate, (MACROBID ) 100 MG capsule Take 1 capsule (100 mg total) by mouth 2 (two) times daily. 11/25/17   Melonie Tori Mikel CHRISTELLA, MD  promethazine  (PHENERGAN ) 25 MG suppository Place 1 suppository (25 mg total) rectally every 6 (six) hours as needed for nausea or vomiting. 08/22/21   Sponseller, Pleasant SAUNDERS, PA-C    Family History Family History  Problem Relation Age of Onset   Diverticulitis Mother    Diabetes Mother    Hypertension Mother    Hypertension Father    Diabetes Maternal Grandmother    Diabetes Maternal Aunt     Social History Social History   Tobacco Use   Smoking status: Never   Smokeless tobacco: Never  Vaping Use   Vaping status: Never Used  Substance Use Topics   Alcohol use: Yes   Drug use: No      Allergies   Bupropion hcl and Phentermine hcl   Review of Systems Review of Systems  Constitutional:  Negative for chills and fever.  HENT:  Positive for congestion, facial swelling and rhinorrhea. Negative for ear pain, sinus pressure, sinus pain and sore throat.   Eyes:  Positive for discharge. Negative for photophobia.  Genitourinary:  Negative for dysuria and vaginal discharge.  Skin:  Negative for rash.  Neurological:  Negative for headaches.     Physical Exam Updated Vital Signs BP (!) 132/92 (BP Location: Left Arm) Comment (BP Location): large cuff  Pulse 85   Temp (!) 97.5 F (36.4 C) (Oral)   Resp 20   LMP 08/23/2024   SpO2 98%   Physical Exam Constitutional:      Appearance: Normal appearance.  HENT:     Right Ear: Tympanic membrane, ear canal and external ear normal.     Left Ear: Tympanic membrane, ear canal and external ear normal.     Nose: Rhinorrhea present.     Mouth/Throat:     Mouth: Mucous membranes are moist.     Pharynx: Oropharynx is clear. No posterior oropharyngeal erythema.  Eyes:     General: Lids are normal. Vision grossly intact.        Right eye: Discharge present.     Extraocular Movements: Extraocular movements intact.     Conjunctiva/sclera:     Right eye: Right conjunctiva is injected.     Pupils: Pupils are equal, round, and reactive to light.     Comments: Fluorecin stain and woods light exam performed. No fluorecin uptake was noted. No ulceration, foreign body, or dendritic lesion present. Clear mucous like discharge noted coming from the medial canthus.   Mild periorbital swelling without erythema, tenderness, or warmth present.   Cardiovascular:     Rate and Rhythm: Normal rate and regular rhythm.     Pulses: Normal pulses.     Heart sounds: Normal heart sounds.  Pulmonary:     Effort: Pulmonary effort is normal.     Breath sounds: Normal breath sounds.  Musculoskeletal:     Cervical back: Neck supple.   Lymphadenopathy:     Cervical: No cervical adenopathy.  Neurological:     Mental Status: She is alert.      ED Treatments / Results  Labs (all labs ordered are listed, but only abnormal results are displayed) Labs Reviewed  CYTOLOGY, (ORAL, ANAL, URETHRAL) ANCILLARY ONLY    EKG  Radiology No results found.  Procedures Procedures (including critical care time)  Medications Ordered in ED Medications - No data to display   Initial Impression / Assessment and Plan / ED Course  I have reviewed the triage vital signs and the nursing notes.  Pertinent labs & imaging results that were available during my care of the patient were reviewed by me and considered in my medical decision making (see chart for details).    Visual acuity performed and results reviewed as documented by nursing staff.   GC/Chlamydia cytology of the ocular discharge was obtained and results are pending. Will update plan of care if needed.   Bacterial Conjunctivitis  Consulted Dr. Fleeta with ophthalmology via phone. After discussing the case with Dr. Fleeta we will treat this as a resistant bacterial conjunctivitis. Start Moxifloxacin 0.5% opthalmic solution 4 times a day for 7 days. Maxitrol 0.1% ophthalmic ointment two times a day for 7 days. Pt to follow up with ophthalmology in 2-3 days if not improving.   Allergic Rhinitis  Allergic rhinitis uncontrolled by her current therapy with levocetirizine. Encouraged her to add on fluticasone  nasal spray 50 mcg/act 1-2 sprays daily.   Red flag symptoms reviewed and return precautions given.    Final Clinical Impressions(s) / ED Diagnoses   Final diagnoses:  None    New Prescriptions New Prescriptions   No medications on file

## 2024-09-02 NOTE — Telephone Encounter (Signed)
 Medication has been resent to the pharmacy.

## 2024-09-02 NOTE — ED Triage Notes (Signed)
 Right eye red, irritated.  Did a televisit on10/17/2025 and was issued erythromycin ophthalmic ointment.  Patient states this medicine has not helped symptoms at all.  Right eye is red, watery, swollen.  Patient has had a runny nose for 2 weeks.  No other medicines taken for symptoms.    Patient does not wear contacts

## 2024-09-02 NOTE — ED Provider Notes (Addendum)
 [facility]    CSN: [id number] Arrival date & time: 09/02/24  9082      History   Chief Complaint Chief Complaint  Patient presents with   Eye Problem    Eye is red and itchy and watery - Entered by patient   Appointment    9:30    HPI Morgan Ward is a 33 y.o. female.   Morgan Ward is a 33 y.o. female presenting for chief complaint of right eye irritation, redness, and itching that initially started 2 weeks ago. She has had green goopy drainage to the medial canthus of the right eye upon waking each morning for the last 10 days. The green thick drainage improves throughout the morning, then watery drainage to the right eye persists throughout the day. Denies recent trauma/injuries to the eye, new vision changes, photophobia, headache, ear pain, dizziness, sore throat, and cough. She is experiencing nasal congestion/rhinorrhea bilaterally and attributes this to allergies. Takes Xyzal for allergic rhinitis, she does not use Flonase  currently.  She did an e-visit on August 21, 2024 where she was prescribed erythromycin eye ointment for conjunctivitis.  She has been using erythromycin eye ointment 4 times a day for the last 7 days without any relief or improvement in eye symptoms.  Left eye is asymptomatic. She is sexually active with 1 female partner unprotected.  Denies vaginal symptoms or concern for STI to the eye.   She wears glasses for vision correction, denies contact lens use.  No periocular rash. She has never had chickenpox in the past and states that she was told during her last pregnancy that she is not protected against varicella.     Past Medical History:  Diagnosis Date   Allergic rhinitis, cause unspecified    Anxiety    Depression    Depression    Diabetes mellitus without complication (HCC)    History of angioedema    Nephrolithiasis    Obesity    PCOS (polycystic ovarian syndrome)    Recurrent herpes labialis     Patient Active Problem  List   Diagnosis Date Noted   S/P cesarean section 11/23/2016   Allergic rhinitis    PCOS (polycystic ovarian syndrome)    Nephrolithiasis    Depression with anxiety    BMI 40.0-44.9, adult (HCC)    Recurrent herpes labialis     Past Surgical History:  Procedure Laterality Date   CESAREAN SECTION     CESAREAN SECTION N/A 11/23/2016   Procedure: CESAREAN SECTION;  Surgeon: Truman Corona, MD;  Location: [facility] BIRTHING SUITES;  Service: Obstetrics;  Laterality: N/A;  Repeat edc 11/30/16 nkda need RNFA    OB History     Gravida  2   Para  2   Term  2   Preterm      AB      Living  2      SAB      IAB      Ectopic      Multiple  0   Live Births  2            Home Medications    Prior to Admission medications   Medication Sig Start Date End Date Taking? Authorizing Provider  moxifloxacin (VIGAMOX) 0.5 % ophthalmic solution Place 1 drop into the right eye in the morning, at noon, in the evening, and at bedtime for 7 days. 09/02/24 09/09/24 Yes StanhopeDorna HERO, FNP  neomycin-polymyxin-dexameth (MAXITROL) 0.1 % OINT Place  1 Application into the right eye in the morning and at bedtime for 7 days. 09/02/24 09/09/24 Yes StanhopeDorna HERO, FNP  acetaminophen  (TYLENOL ) 325 MG tablet Take 650 mg by mouth every 6 (six) hours as needed (for pain.).    [provider]  escitalopram (LEXAPRO) 10 MG tablet Take 10 mg by mouth daily.    [provider]  hydrOXYzine  (ATARAX /VISTARIL ) 25 MG tablet Take 0.5-1 tablets (12.5-25 mg total) by mouth every 8 (eight) hours as needed for anxiety. 07/30/17   Juliane Che, PA  nitrofurantoin , macrocrystal-monohydrate, (MACROBID ) 100 MG capsule Take 1 capsule (100 mg total) by mouth 2 (two) times daily. 11/25/17   Melonie Colonel, Mikel HERO, MD  promethazine  (PHENERGAN ) 25 MG suppository Place 1 suppository (25 mg total) rectally every 6 (six) hours as needed for nausea or vomiting. 08/22/21   Sponseller, Pleasant SAUNDERS,  PA-C    Family History Family History  Problem Relation Age of Onset   Diverticulitis Mother    Diabetes Mother    Hypertension Mother    Hypertension Father    Diabetes Maternal Grandmother    Diabetes Maternal Aunt     Social History Social History   Tobacco Use   Smoking status: Never   Smokeless tobacco: Never  Vaping Use   Vaping status: Never Used  Substance Use Topics   Alcohol use: Yes   Drug use: No     Allergies   Bupropion hcl and Phentermine hcl   Review of Systems Review of Systems Per HPI  Physical Exam Triage Vital Signs ED Triage Vitals  Encounter Vitals Group     BP 09/02/24 0946 (!) 132/92     Girls Systolic BP Percentile --      Girls Diastolic BP Percentile --      Boys Systolic BP Percentile --      Boys Diastolic BP Percentile --      Pulse Rate 09/02/24 0946 85     Resp 09/02/24 0946 20     Temp 09/02/24 0946 (!) 97.5 F (36.4 C)     Temp Source 09/02/24 0946 Oral     SpO2 09/02/24 0946 98 %     Weight --      Height --      Head Circumference --      Peak Flow --      Pain Score 09/02/24 0943 4     Pain Loc --      Pain Education --      Exclude from Growth Chart --    No data found.  Updated Vital Signs BP (!) 132/92 (BP Location: Left Arm) Comment (BP Location): large cuff  Pulse 85   Temp (!) 97.5 F (36.4 C) (Oral)   Resp 20   LMP 08/23/2024   SpO2 98%   Visual Acuity Right Eye Distance: 20/70 Left Eye Distance: 20/200 (patient does wear glasses, but is not wearing them today) Bilateral Distance: 20/50  Right Eye Near:   Left Eye Near:    Bilateral Near:     Physical Exam Vitals and nursing note reviewed.  Constitutional:      Appearance: She is not ill-appearing or toxic-appearing.  HENT:     Head: Normocephalic and atraumatic.     Right Ear: Hearing and external ear normal.     Left Ear: Hearing and external ear normal.     Nose: Nose normal.     Mouth/Throat:     Lips: Pink.  Eyes:  General:  Lids are normal. Lids are everted, no foreign bodies appreciated. Vision grossly intact. Gaze aligned appropriately. No allergic shiner or visual field deficit.       Right eye: Discharge (Scant green discharge to the medial canthus.  Watery discharge.) present. No foreign body or hordeolum.     Extraocular Movements: Extraocular movements intact.     Conjunctiva/sclera:     Right eye: Right conjunctiva is injected. No chemosis, exudate or hemorrhage.    Pupils: Pupils are equal, round, and reactive to light.     Right eye: No corneal abrasion or fluorescein uptake. Seidel exam negative.     Visual Fields: Right eye visual fields normal and left eye visual fields normal.     Comments: EOMs intact without pain or dizziness elicited.   Pulmonary:     Effort: Pulmonary effort is normal.  Musculoskeletal:     Cervical back: Neck supple.  Skin:    General: Skin is warm and dry.     Capillary Refill: Capillary refill takes less than 2 seconds.     Findings: No rash.  Neurological:     General: No focal deficit present.     Mental Status: She is alert and oriented to person, place, and time. Mental status is at baseline.     Cranial Nerves: No dysarthria or facial asymmetry.  Psychiatric:        Mood and Affect: Mood normal.        Speech: Speech normal.        Behavior: Behavior normal.        Thought Content: Thought content normal.        Judgment: Judgment normal.      UC Treatments / Results  Labs (all labs ordered are listed, but only abnormal results are displayed) Labs Reviewed  CYTOLOGY, (ORAL, ANAL, URETHRAL) ANCILLARY ONLY    EKG   Radiology No results found.  Procedures Procedures (including critical care time)  Medications Ordered in UC Medications - No data to display  Initial Impression / Assessment and Plan / UC Course  I have reviewed the triage vital signs and the nursing notes.  Pertinent labs & imaging results that were available during my care of the  patient were reviewed by me and considered in my medical decision making (see chart for details).   1. Bacterial conjunctivitis of right eye, allergic rhinitis Fluorescein stain is unremarkable for signs of dendritic lesions (Zoster), trauma, corneal ulceration/abrasion.  Presentation is consistent with bacterial conjunctivitis, though symptoms have not responded well to use of erythromycin.   Dr. Fleeta ophthalmologist consulted and recommends stopping erythromycin. Start moxifloxacin 1 drop QID and Maxitrol ointment (polymyxin, Neosporin, dexamethasone) twice daily for 7 days.  Cool compresses.  Follow-up with ophthalmology in the next 2 to 3 days if symptoms fail to improve.  Add Flonase  to allergic rhinitis regimen.  Counseled patient on potential for adverse effects with medications prescribed/recommended today, strict ER and return-to-clinic precautions discussed, patient verbalized understanding.    Final Clinical Impressions(s) / UC Diagnoses   Final diagnoses:  Bacterial conjunctivitis of right eye  Allergic rhinitis due to other allergic trigger, unspecified seasonality     Discharge Instructions      Apply the polymyxin-neomycin-dexamethasone (Maxitrol) ointment every 12 hours for the next 7 days.  This ointment contains a steroid.  Please use the moxifloxacin eyedrops every 6 hours for the next 7 days by introducing 1 drop into the right eye every 6 hours for the next 7  days.  Use cool compresses to the right eye.  If your symptoms still have not improved in the next 2 days, please call Dr. Fleeta at the phone number listed above.  If you develop any new or worsening symptoms or if your symptoms do not start to improve, please return here or follow-up with your primary care provider. If your symptoms are severe, please go to the emergency room.     ED Prescriptions     Medication Sig Dispense Auth. Provider   moxifloxacin (VIGAMOX) 0.5 % ophthalmic solution Place 1  drop into the right eye in the morning, at noon, in the evening, and at bedtime for 7 days. 3 mL Enedelia Going M, FNP   neomycin-polymyxin-dexameth (MAXITROL) 0.1 % OINT Place 1 Application into the right eye in the morning and at bedtime for 7 days. 14 g Enedelia Going HERO, FNP      PDMP not reviewed this encounter.   Enedelia Going HERO, FNP 09/02/24 1154    Enedelia Going HERO, FNP 09/02/24 1154

## 2024-09-02 NOTE — Discharge Instructions (Addendum)
 Apply the polymyxin-neomycin-dexamethasone (Maxitrol) ointment every 12 hours for the next 7 days.  This ointment contains a steroid.  Please use the moxifloxacin eyedrops every 6 hours for the next 7 days by introducing 1 drop into the right eye every 6 hours for the next 7 days.  Use cool compresses to the right eye.  If your symptoms still have not improved in the next 2 days, please call Dr. Fleeta at the phone number listed above.  If you develop any new or worsening symptoms or if your symptoms do not start to improve, please return here or follow-up with your primary care provider. If your symptoms are severe, please go to the emergency room.

## 2024-09-03 LAB — CYTOLOGY, (ORAL, ANAL, URETHRAL) ANCILLARY ONLY
Chlamydia: POSITIVE — AB
Comment: NEGATIVE
Comment: NORMAL
Neisseria Gonorrhea: NEGATIVE

## 2024-09-04 ENCOUNTER — Ambulatory Visit (HOSPITAL_COMMUNITY): Payer: Self-pay

## 2024-09-04 MED ORDER — DOXYCYCLINE HYCLATE 100 MG PO TABS: 100.0000 mg | ORAL_TABLET | Freq: Two times a day (BID) | ORAL | 0 refills | Status: AC

## 2024-09-04 MED ORDER — DOXYCYCLINE HYCLATE 100 MG PO TABS: 100.0000 mg | ORAL_TABLET | Freq: Two times a day (BID) | ORAL | 0 refills | Status: DC
